# Patient Record
Sex: Female | Born: 1955
Health system: Southern US, Community
[De-identification: ages and names within clinical notes are randomized; demographics above are authoritative.]

## PROBLEM LIST (undated history)

## (undated) DIAGNOSIS — R112 Nausea with vomiting, unspecified: Secondary | ICD-10-CM

## (undated) DIAGNOSIS — F419 Anxiety disorder, unspecified: Secondary | ICD-10-CM

## (undated) DIAGNOSIS — Z98811 Dental restoration status: Secondary | ICD-10-CM

## (undated) DIAGNOSIS — K30 Functional dyspepsia: Secondary | ICD-10-CM

## (undated) DIAGNOSIS — T7840XA Allergy, unspecified, initial encounter: Secondary | ICD-10-CM

## (undated) DIAGNOSIS — Z9889 Other specified postprocedural states: Secondary | ICD-10-CM

## (undated) DIAGNOSIS — D369 Benign neoplasm, unspecified site: Secondary | ICD-10-CM

## (undated) HISTORY — DX: Allergy, unspecified, initial encounter: T78.40XA

## (undated) HISTORY — PX: TONSILLECTOMY: SUR1361

## (undated) HISTORY — PX: CHOLECYSTECTOMY: SHX55

## (undated) HISTORY — PX: TUBAL LIGATION: SHX77

## (undated) HISTORY — PX: WISDOM TOOTH EXTRACTION: SHX21

---

## 1997-12-03 ENCOUNTER — Other Ambulatory Visit: Admission: RE | Admit: 1997-12-03 | Discharge: 1997-12-03 | Payer: Self-pay | Admitting: *Deleted

## 1997-12-14 ENCOUNTER — Ambulatory Visit (HOSPITAL_COMMUNITY): Admission: RE | Admit: 1997-12-14 | Discharge: 1997-12-14 | Payer: Self-pay | Admitting: *Deleted

## 1999-05-22 ENCOUNTER — Other Ambulatory Visit: Admission: RE | Admit: 1999-05-22 | Discharge: 1999-05-22 | Payer: Self-pay | Admitting: *Deleted

## 2000-09-20 ENCOUNTER — Other Ambulatory Visit: Admission: RE | Admit: 2000-09-20 | Discharge: 2000-09-20 | Payer: Self-pay | Admitting: *Deleted

## 2001-10-09 ENCOUNTER — Other Ambulatory Visit: Admission: RE | Admit: 2001-10-09 | Discharge: 2001-10-09 | Payer: Self-pay | Admitting: *Deleted

## 2003-04-14 ENCOUNTER — Other Ambulatory Visit: Admission: RE | Admit: 2003-04-14 | Discharge: 2003-04-14 | Payer: Self-pay | Admitting: *Deleted

## 2003-07-07 ENCOUNTER — Emergency Department (HOSPITAL_COMMUNITY): Admission: AC | Admit: 2003-07-07 | Discharge: 2003-07-07 | Payer: Self-pay

## 2005-04-12 ENCOUNTER — Other Ambulatory Visit: Admission: RE | Admit: 2005-04-12 | Discharge: 2005-04-12 | Payer: Self-pay | Admitting: *Deleted

## 2011-10-22 ENCOUNTER — Ambulatory Visit (INDEPENDENT_AMBULATORY_CARE_PROVIDER_SITE_OTHER): Payer: BC Managed Care – PPO | Admitting: Family Medicine

## 2011-10-22 VITALS — BP 122/68 | HR 87 | Temp 98.0°F | Resp 16 | Ht 65.75 in | Wt 224.0 lb

## 2011-10-22 DIAGNOSIS — M25879 Other specified joint disorders, unspecified ankle and foot: Secondary | ICD-10-CM

## 2011-10-22 NOTE — Progress Notes (Signed)
  Subjective:    Patient ID: Daira Hine, female    DOB: [birth year], 56 y.o.   MRN: [id number]  HPI Neviah Braud is a 56 y.o. female Cyst on back of R ankle - past 8-9 months.   Seen in January by dermatology - benign cyst, told may enlarge, but to follow .   Has enlarged at times - bigger past week.  Irritated at times for up to 30 seconds. Hot compresses help temporarily. Tried multiple otc topical treatments - no relief.  Teacher - Proofreader.   Review of Systems  Constitutional: Negative for fever and chills.  Skin: Negative for rash.       Cyst on ankle.       Objective:   Physical Exam  Constitutional: She appears well-developed.  Pulmonary/Chest: Effort normal.  Musculoskeletal:       Feet:  Skin: Skin is warm, dry and intact. No ecchymosis and no rash noted. No erythema.       Assessment & Plan:  Malori Myers is a 56 y.o. female 1. Cyst of joint of ankle or foot    Refer to ortho - Dr. Victorino Dike for removal due to proximity of achilles. In meantime, avoid direct pressure to area/minimize friction.  rtc precautions.

## 2011-11-15 DIAGNOSIS — D369 Benign neoplasm, unspecified site: Secondary | ICD-10-CM

## 2011-11-15 HISTORY — DX: Benign neoplasm, unspecified site: D36.9

## 2011-12-14 ENCOUNTER — Encounter (HOSPITAL_BASED_OUTPATIENT_CLINIC_OR_DEPARTMENT_OTHER): Payer: Self-pay | Admitting: *Deleted

## 2011-12-20 ENCOUNTER — Encounter (HOSPITAL_BASED_OUTPATIENT_CLINIC_OR_DEPARTMENT_OTHER): Payer: Self-pay | Admitting: Certified Registered Nurse Anesthetist

## 2011-12-20 ENCOUNTER — Ambulatory Visit (HOSPITAL_BASED_OUTPATIENT_CLINIC_OR_DEPARTMENT_OTHER): Payer: BC Managed Care – PPO | Admitting: Certified Registered Nurse Anesthetist

## 2011-12-20 ENCOUNTER — Encounter (HOSPITAL_BASED_OUTPATIENT_CLINIC_OR_DEPARTMENT_OTHER): Payer: Self-pay | Admitting: *Deleted

## 2011-12-20 ENCOUNTER — Ambulatory Visit (HOSPITAL_BASED_OUTPATIENT_CLINIC_OR_DEPARTMENT_OTHER)
Admission: RE | Admit: 2011-12-20 | Discharge: 2011-12-20 | Disposition: A | Discharge status: Home / Self Care | Payer: BC Managed Care – PPO | Source: Ambulatory Visit | Attending: Orthopedic Surgery | Admitting: Orthopedic Surgery

## 2011-12-20 ENCOUNTER — Encounter (HOSPITAL_BASED_OUTPATIENT_CLINIC_OR_DEPARTMENT_OTHER)
Admission: RE | Disposition: A | Discharge status: Home / Self Care | Payer: Self-pay | Source: Ambulatory Visit | Attending: Orthopedic Surgery

## 2011-12-20 DIAGNOSIS — K3189 Other diseases of stomach and duodenum: Secondary | ICD-10-CM | POA: Insufficient documentation

## 2011-12-20 DIAGNOSIS — R1013 Epigastric pain: Secondary | ICD-10-CM | POA: Insufficient documentation

## 2011-12-20 DIAGNOSIS — R229 Localized swelling, mass and lump, unspecified: Secondary | ICD-10-CM

## 2011-12-20 DIAGNOSIS — D2120 Benign neoplasm of connective and other soft tissue of unspecified lower limb, including hip: Secondary | ICD-10-CM | POA: Insufficient documentation

## 2011-12-20 HISTORY — DX: Benign neoplasm, unspecified site: D36.9

## 2011-12-20 HISTORY — DX: Nausea with vomiting, unspecified: Z98.890

## 2011-12-20 HISTORY — DX: Dental restoration status: Z98.811

## 2011-12-20 HISTORY — DX: Nausea with vomiting, unspecified: R11.2

## 2011-12-20 HISTORY — DX: Functional dyspepsia: K30

## 2011-12-20 HISTORY — PX: MASS EXCISION: SHX2000

## 2011-12-20 HISTORY — DX: Anxiety disorder, unspecified: F41.9

## 2011-12-20 LAB — POCT HEMOGLOBIN-HEMACUE: Hemoglobin: 11.3 g/dL — ABNORMAL LOW (ref 12.0–15.0)

## 2011-12-20 SURGERY — EXCISION MASS
Anesthesia: General | Site: Ankle | Laterality: Right | Wound class: Clean

## 2011-12-20 MED ORDER — DEXAMETHASONE SODIUM PHOSPHATE 10 MG/ML IJ SOLN
INTRAMUSCULAR | Status: DC | PRN
  Administered 2011-12-20: 10 mg via INTRAVENOUS

## 2011-12-20 MED ORDER — MIDAZOLAM HCL 5 MG/5ML IJ SOLN
INTRAMUSCULAR | Status: DC | PRN
  Administered 2011-12-20: 1 mg via INTRAVENOUS

## 2011-12-20 MED ORDER — HYDROCODONE-ACETAMINOPHEN 5-325 MG PO TABS: 1.0000 | ORAL_TABLET | Freq: Four times a day (QID) | ORAL | Status: AC | PRN

## 2011-12-20 MED ORDER — ONDANSETRON HCL 4 MG/2ML IJ SOLN
INTRAMUSCULAR | Status: DC | PRN
  Administered 2011-12-20: 4 mg via INTRAVENOUS

## 2011-12-20 MED ORDER — FENTANYL CITRATE 0.05 MG/ML IJ SOLN
INTRAMUSCULAR | Status: DC | PRN
  Administered 2011-12-20: 50 ug via INTRAVENOUS

## 2011-12-20 MED ORDER — OXYCODONE HCL 5 MG/5ML PO SOLN: 5.0000 mg | Freq: Once | ORAL | Status: DC | PRN

## 2011-12-20 MED ORDER — CEFAZOLIN SODIUM-DEXTROSE 2-3 GM-% IV SOLR
2.0000 g | INTRAVENOUS | Status: AC
  Administered 2011-12-20: 2 g via INTRAVENOUS

## 2011-12-20 MED ORDER — SCOPOLAMINE 1 MG/3DAYS TD PT72
1.0000 | MEDICATED_PATCH | Freq: Once | TRANSDERMAL | Status: DC
  Administered 2011-12-20: 1.5 mg via TRANSDERMAL

## 2011-12-20 MED ORDER — BUPIVACAINE HCL (PF) 0.25 % IJ SOLN
INTRAMUSCULAR | Status: DC | PRN
  Administered 2011-12-20: 8 mL

## 2011-12-20 MED ORDER — LACTATED RINGERS IV SOLN
INTRAVENOUS | Status: DC
  Administered 2011-12-20: 20 mL/h via INTRAVENOUS
  Administered 2011-12-20: 12:00:00 via INTRAVENOUS

## 2011-12-20 MED ORDER — CHLORHEXIDINE GLUCONATE 4 % EX LIQD: 60.0000 mL | Freq: Once | CUTANEOUS | Status: DC

## 2011-12-20 MED ORDER — OXYCODONE HCL 5 MG PO TABS: 5.0000 mg | ORAL_TABLET | Freq: Once | ORAL | Status: DC | PRN

## 2011-12-20 MED ORDER — ACETAMINOPHEN 10 MG/ML IV SOLN
1000.0000 mg | Freq: Once | INTRAVENOUS | Status: AC
  Administered 2011-12-20: 1000 mg via INTRAVENOUS

## 2011-12-20 MED ORDER — METOCLOPRAMIDE HCL 5 MG/ML IJ SOLN
INTRAMUSCULAR | Status: DC | PRN
  Administered 2011-12-20: 10 mg via INTRAVENOUS

## 2011-12-20 MED ORDER — PROPOFOL 10 MG/ML IV BOLUS
INTRAVENOUS | Status: DC | PRN
  Administered 2011-12-20: 250 mg via INTRAVENOUS

## 2011-12-20 MED ORDER — HYDROMORPHONE HCL PF 1 MG/ML IJ SOLN: 0.2500 mg | INTRAMUSCULAR | Status: DC | PRN

## 2011-12-20 MED ORDER — METOCLOPRAMIDE HCL 5 MG/ML IJ SOLN
10.0000 mg | Freq: Once | INTRAMUSCULAR | Status: AC | PRN
  Administered 2011-12-20: 10 mg via INTRAVENOUS

## 2011-12-20 MED ORDER — LIDOCAINE HCL (CARDIAC) 20 MG/ML IV SOLN
INTRAVENOUS | Status: DC | PRN
  Administered 2011-12-20: 60 mg via INTRAVENOUS

## 2011-12-20 MED ORDER — SODIUM CHLORIDE 0.9 % IV SOLN: INTRAVENOUS | Status: DC

## 2011-12-20 MED ORDER — BACITRACIN ZINC 500 UNIT/GM EX OINT
TOPICAL_OINTMENT | CUTANEOUS | Status: DC | PRN
  Administered 2011-12-20: 1 via TOPICAL

## 2011-12-20 SURGICAL SUPPLY — 60 items
BANDAGE CONFORM 2  STR LF (GAUZE/BANDAGES/DRESSINGS) IMPLANT
BANDAGE CONFORM 3  STR LF (GAUZE/BANDAGES/DRESSINGS) ×3 IMPLANT
BANDAGE ELASTIC 4 VELCRO ST LF (GAUZE/BANDAGES/DRESSINGS) ×2 IMPLANT
BANDAGE ESMARK 6X9 LF (GAUZE/BANDAGES/DRESSINGS) IMPLANT
BLADE MINI RND TIP GREEN BEAV (BLADE) ×1 IMPLANT
BLADE SURG 15 STRL LF DISP TIS (BLADE) ×4 IMPLANT
BLADE SURG 15 STRL SS (BLADE) ×6
BNDG CMPR 9X4 STRL LF SNTH (GAUZE/BANDAGES/DRESSINGS) ×2
BNDG CMPR 9X6 STRL LF SNTH (GAUZE/BANDAGES/DRESSINGS)
BNDG COHESIVE 4X5 TAN STRL (GAUZE/BANDAGES/DRESSINGS) ×1 IMPLANT
BNDG ESMARK 4X9 LF (GAUZE/BANDAGES/DRESSINGS) ×3 IMPLANT
BNDG ESMARK 6X9 LF (GAUZE/BANDAGES/DRESSINGS)
CHLORAPREP W/TINT 26ML (MISCELLANEOUS) ×3 IMPLANT
CLOTH BEACON ORANGE TIMEOUT ST (SAFETY) ×3 IMPLANT
CORDS BIPOLAR (ELECTRODE) ×3 IMPLANT
COVER TABLE BACK 60X90 (DRAPES) ×3 IMPLANT
CUFF TOURNIQUET SINGLE 18IN (TOURNIQUET CUFF) IMPLANT
DRAPE EXTREMITY T 121X128X90 (DRAPE) ×3 IMPLANT
DRAPE SURG 17X23 STRL (DRAPES) ×3 IMPLANT
DRSG EMULSION OIL 3X3 NADH (GAUZE/BANDAGES/DRESSINGS) ×3 IMPLANT
DRSG PAD ABDOMINAL 8X10 ST (GAUZE/BANDAGES/DRESSINGS) ×1 IMPLANT
DRSG TEGADERM 4X4.75 (GAUZE/BANDAGES/DRESSINGS) ×2 IMPLANT
ELECT REM PT RETURN 9FT ADLT (ELECTROSURGICAL) ×3
ELECTRODE REM PT RTRN 9FT ADLT (ELECTROSURGICAL) ×2 IMPLANT
GLOVE BIO SURGEON STRL SZ8 (GLOVE) ×3 IMPLANT
GLOVE BIOGEL PI IND STRL 8 (GLOVE) ×2 IMPLANT
GLOVE BIOGEL PI INDICATOR 8 (GLOVE) ×1
GOWN PREVENTION PLUS XLARGE (GOWN DISPOSABLE) ×3 IMPLANT
GOWN PREVENTION PLUS XXLARGE (GOWN DISPOSABLE) ×3 IMPLANT
NDL HYPO 25X1 1.5 SAFETY (NEEDLE) IMPLANT
NEEDLE HYPO 25X1 1.5 SAFETY (NEEDLE) IMPLANT
NS IRRIG 1000ML POUR BTL (IV SOLUTION) ×3 IMPLANT
PACK BASIN DAY SURGERY FS (CUSTOM PROCEDURE TRAY) ×3 IMPLANT
PAD CAST 4YDX4 CTTN HI CHSV (CAST SUPPLIES) ×1 IMPLANT
PADDING CAST ABS 4INX4YD NS (CAST SUPPLIES)
PADDING CAST ABS COTTON 4X4 ST (CAST SUPPLIES) ×1 IMPLANT
PADDING CAST COTTON 4X4 STRL (CAST SUPPLIES)
SHEET MEDIUM DRAPE 40X70 STRL (DRAPES) ×3 IMPLANT
SPONGE GAUZE 4X4 12PLY (GAUZE/BANDAGES/DRESSINGS) ×6 IMPLANT
SPONGE LAP 18X18 X RAY DECT (DISPOSABLE) ×3 IMPLANT
STOCKINETTE 4X48 STRL (DRAPES) IMPLANT
STOCKINETTE 6  STRL (DRAPES) ×1
STOCKINETTE 6 STRL (DRAPES) ×1 IMPLANT
STRIP CLOSURE SKIN 1/2X4 (GAUZE/BANDAGES/DRESSINGS) ×3 IMPLANT
SUCTION FRAZIER TIP 10 FR DISP (SUCTIONS) IMPLANT
SUT ETHILON 4 0 PS 2 18 (SUTURE) ×3 IMPLANT
SUT MNCRL AB 4-0 PS2 18 (SUTURE) ×3 IMPLANT
SUT PROLENE 3 0 PS 1 (SUTURE) ×3 IMPLANT
SUT PROLENE 3 0 PS 2 (SUTURE) ×2 IMPLANT
SUT VIC AB 2-0 SH 18 (SUTURE) IMPLANT
SUT VIC AB 3-0 PS1 18 (SUTURE)
SUT VIC AB 3-0 PS1 18XBRD (SUTURE) IMPLANT
SUT VICRYL 4-0 PS2 18IN ABS (SUTURE) IMPLANT
SYR BULB 3OZ (MISCELLANEOUS) ×3 IMPLANT
SYR CONTROL 10ML LL (SYRINGE) ×2 IMPLANT
TOWEL OR 17X24 6PK STRL BLUE (TOWEL DISPOSABLE) ×3 IMPLANT
TUBE CONNECTING 20X1/4 (TUBING) IMPLANT
UNDERPAD 30X30 INCONTINENT (UNDERPADS AND DIAPERS) ×3 IMPLANT
WATER STERILE IRR 1000ML POUR (IV SOLUTION) ×1 IMPLANT
YANKAUER SUCT BULB TIP NO VENT (SUCTIONS) IMPLANT

## 2011-12-20 NOTE — Anesthesia Preprocedure Evaluation (Signed)
Anesthesia Evaluation  Patient identified by MRN, date of birth, ID band Patient awake    Reviewed: Allergy & Precautions, H&P , NPO status , Patient's Chart, lab work & pertinent test results, reviewed documented beta blocker date and time   History of Anesthesia Complications (+) PONV  Airway Mallampati: II TM Distance: >3 FB Neck ROM: full    Dental   Pulmonary neg pulmonary ROS,  breath sounds clear to auscultation        Cardiovascular negative cardio ROS  Rhythm:regular     Neuro/Psych Anxiety negative neurological ROS  negative psych ROS   GI/Hepatic negative GI ROS, Neg liver ROS,   Endo/Other  negative endocrine ROS  Renal/GU negative Renal ROS  negative genitourinary   Musculoskeletal   Abdominal   Peds  Hematology negative hematology ROS (+)   Anesthesia Other Findings See surgeon's H&P   Reproductive/Obstetrics negative OB ROS                           Anesthesia Physical Anesthesia Plan  ASA: II  Anesthesia Plan: General   Post-op Pain Management:    Induction: Intravenous  Airway Management Planned: LMA  Additional Equipment:   Intra-op Plan:   Post-operative Plan: Extubation in OR  Informed Consent: I have reviewed the patients History and Physical, chart, labs and discussed the procedure including the risks, benefits and alternatives for the proposed anesthesia with the patient or authorized representative who has indicated his/her understanding and acceptance.   Dental Advisory Given  Plan Discussed with: CRNA and Surgeon  Anesthesia Plan Comments:         Anesthesia Quick Evaluation

## 2011-12-20 NOTE — H&P (Signed)
Caroline Bullock is an 56 y.o. female.   Chief Complaint: mass right ankle HPI: 56 y/o female with painful mass at posterior right ankle.  She presents now for excision.  Past Medical History  Diagnosis Date  . PONV (postoperative nausea and vomiting)   . Acid indigestion     rare - TUMS as needed  . Anxiety     no current meds.  . Dental crowns present     also a lower dental implant  . Benign tumor 11/2011    right ankle    Past Surgical History  Procedure Date  . Tonsillectomy   . Cesarean section     x 3  . Cholecystectomy   . Wisdom tooth extraction     History reviewed. No pertinent family history. Social History:  reports that she has never smoked. She has never used smokeless tobacco. She reports that she drinks alcohol. She reports that she does not use illicit drugs.  Allergies:  Allergies  Allergen Reactions  . Benadryl (Diphenhydramine Hcl) Hives  . Fish-Derived Products Nausea And Vomiting  . Penicillins Hives  . Shellfish Allergy Nausea And Vomiting    No prescriptions prior to admission    No results found for this or any previous visit (from the past 48 hour(s)). No results found.  ROS  No recent f/c/n/v/ wt loss  Height 5\' 6"  (1.676 m), weight 97.523 kg (215 lb). Physical Exam  wn wd woman in nad.  A and O X 4.  Mood and affect normal.  EOMI.  Respirations unlabored.  R distal leg with small subcutaneous mass adjacent to achilles tendon.  TTP.  Skin heatlhy.  No lymphadenopathy.  Sens to LT intact in sural n dist.  5/5 strength in PF and DF of ankle.  Assessment/Plan Right leg mass - to OR for excision.  The risks and benefits of the alternative treatment options have been discussed in detail.  The patient wishes to proceed with surgery and specifically understands risks of bleeding, infection, nerve damage, blood clots, need for additional surgery, amputation and death.   Toni Arthurs 01-09-12, 7:24 AM

## 2011-12-20 NOTE — Brief Op Note (Signed)
12/20/2011  12:42 PM  PATIENT:  Caroline Bullock  56 y.o. female  PRE-OPERATIVE DIAGNOSIS:  Right ankle subcutaneous mass  POST-OPERATIVE DIAGNOSIS:  Same  Procedure(s): Excision of right ankle subcutaneous mass  SURGEON:  Toni Arthurs, MD  ASSISTANT: n/a  ANESTHESIA:   General  EBL:  minimal   TOURNIQUET:  3 min at 225 mm Hg  COMPLICATIONS:  None apparent  DISPOSITION:  Extubated, awake and stable to recovery.  DICTATION ID:  [id number]

## 2011-12-20 NOTE — Transfer of Care (Signed)
Immediate Anesthesia Transfer of Care Note  Patient: Caroline Bullock  Procedure(s) Performed: Procedure(s) (LRB) with comments: EXCISION LIPOMA (Right)  Patient Location: PACU  Anesthesia Type: General  Level of Consciousness: awake, alert , oriented and patient cooperative  Airway & Oxygen Therapy: Patient Spontanous Breathing and Patient connected to face mask oxygen  Post-op Assessment: Report given to PACU RN and Post -op Vital signs reviewed and stable  Post vital signs: Reviewed and stable  Complications: No apparent anesthesia complications

## 2011-12-20 NOTE — Anesthesia Postprocedure Evaluation (Signed)
Anesthesia Post Note  Patient: Caroline Bullock  Procedure(s) Performed: Procedure(s) (LRB): EXCISION MASS (Right)  Anesthesia type: General  Patient location: PACU  Post pain: Pain level controlled  Post assessment: Patient's Cardiovascular Status Stable  Last Vitals:  Filed Vitals:   12/20/11 1400  BP: 107/54  Pulse: 86  Temp:   Resp: 16    Post vital signs: Reviewed and stable  Level of consciousness: alert  Complications: No apparent anesthesia complications

## 2011-12-21 ENCOUNTER — Encounter (HOSPITAL_BASED_OUTPATIENT_CLINIC_OR_DEPARTMENT_OTHER): Payer: Self-pay | Admitting: Orthopedic Surgery

## 2011-12-21 NOTE — Op Note (Signed)
NAME:  Caroline Bullock, Caroline Bullock                   ACCOUNT NO.:  [id number]  MEDICAL RECORD NO.:  [id number]  LOCATION:                                 FACILITY:  PHYSICIAN:  Toni Arthurs, MD        DATE OF BIRTH:  1955/05/07  DATE OF PROCEDURE:  12/20/2011 DATE OF DISCHARGE:                              OPERATIVE REPORT   PREOPERATIVE DIAGNOSIS:  Right ankle subcutaneous mass.  POSTOPERATIVE DIAGNOSIS:  Right ankle subcutaneous mass.  PROCEDURE:  Excision of right ankle subcutaneous mass.  SURGEON:  Toni Arthurs, MD  ANESTHESIA:  General.  ESTIMATED BLOOD LOSS:  Minimal.  TOURNIQUET TIME:  3 minutes at 225 mmHg.  COMPLICATIONS:  None apparent.  DISPOSITION:  Extubated, awake and stable to recovery.  INDICATIONS FOR PROCEDURE:  The patient is a 56 year old female who has developed a painful mass at the posterior aspect of her right ankle over the last several months.  This is quite tender to palpation and is bothersome with shoe wear.  She presents now for operative excision of this mass.  She understands the risks, benefits, and the alternative treatment options and elects surgical treatment.  She specifically understands the risks of bleeding, infection, nerve damage, blood clots, need for additional surgery, recurrence of the mass, amputation, and death.  PROCEDURE IN DETAIL:  After preoperative consent was obtained and the correct operative site was identified, the patient was brought to the operating room and placed supine on the operating table.  General anesthesia was induced.  Preoperative antibiotics were administered. Surgical time-out was taken.  The patient was then turned into the lateral decubitus position with the right side up.  The right lower extremity was prepped and draped in standard sterile fashion with tourniquet around the thigh.  Extremity was exsanguinated and the tourniquet was inflated to 225 mmHg.  The patient's mass was identified just on the lateral  aspect of the Achilles tendon at the level of the ankle.  A longitudinal incision was made over the mass.  Sharp dissection was carried down through the skin.  The mass was noted to be approximately 1 cm x 5 mm and roughly capsule shaped.  It was well circumscribed.  Blunt dissection was carried around the mass circumferentially and it was excised in its entirety.  It was sent off the field as a specimen to Pathology.  The wound was irrigated copiously.  There was no evidence of other adjacent masses.  The wound was irrigated.  The tourniquet was released.  Hemostasis was achieved. Inverted simple sutures of 3-0 Monocryl were used to close the subcutaneous tissue and horizontal mattress sutures of 3-0 Prolene were used to close the skin incision.  Sterile dressings were applied after 0.25% Marcaine was infiltrated into the subcutaneous tissue around the incision site.  A compression wrap was then applied.  The patient was then awakened from anesthesia and transported to the recovery room in stable condition.  FOLLOWUP PLAN:  The patient will be weightbearing as tolerated on the right lower extremity.  She will follow up with me in 2 weeks for suture removal.     Toni Arthurs, MD  JH/MEDQ  D:  12/20/2011  T:  12/21/2011  Job:  [id number]

## 2012-03-25 ENCOUNTER — Ambulatory Visit: Payer: BC Managed Care – PPO

## 2012-03-25 ENCOUNTER — Ambulatory Visit (INDEPENDENT_AMBULATORY_CARE_PROVIDER_SITE_OTHER): Payer: BC Managed Care – PPO | Admitting: Family Medicine

## 2012-03-25 VITALS — BP 128/76 | HR 105 | Temp 98.3°F | Resp 17 | Ht 66.0 in | Wt 218.0 lb

## 2012-03-25 DIAGNOSIS — R059 Cough, unspecified: Secondary | ICD-10-CM

## 2012-03-25 DIAGNOSIS — R062 Wheezing: Secondary | ICD-10-CM

## 2012-03-25 DIAGNOSIS — R05 Cough: Secondary | ICD-10-CM

## 2012-03-25 DIAGNOSIS — J40 Bronchitis, not specified as acute or chronic: Secondary | ICD-10-CM

## 2012-03-25 DIAGNOSIS — J019 Acute sinusitis, unspecified: Secondary | ICD-10-CM

## 2012-03-25 LAB — POCT CBC
Granulocyte percent: 54.4 %G (ref 37–80)
HCT, POC: 47.4 % (ref 37.7–47.9)
Hemoglobin: 14.4 g/dL (ref 12.2–16.2)
Lymph, poc: 1.5 (ref 0.6–3.4)
MCH, POC: 28.7 pg (ref 27–31.2)
MCHC: 30.4 g/dL — AB (ref 31.8–35.4)
MCV: 94.5 fL (ref 80–97)
MID (cbc): 0.6 (ref 0–0.9)
MPV: 10 fL (ref 0–99.8)
POC Granulocyte: 2.5 (ref 2–6.9)
POC LYMPH PERCENT: 31.9 %L (ref 10–50)
POC MID %: 13.7 % — AB (ref 0–12)
Platelet Count, POC: 242 10*3/uL (ref 142–424)
RBC: 5.02 M/uL (ref 4.04–5.48)
RDW, POC: 14.8 %
WBC: 4.6 10*3/uL (ref 4.6–10.2)

## 2012-03-25 MED ORDER — DOXYCYCLINE HYCLATE 100 MG PO TABS: 100.0000 mg | ORAL_TABLET | Freq: Two times a day (BID) | ORAL | Status: DC

## 2012-03-25 MED ORDER — ALBUTEROL SULFATE HFA 108 (90 BASE) MCG/ACT IN AERS: 2.0000 | INHALATION_SPRAY | Freq: Four times a day (QID) | RESPIRATORY_TRACT | Status: DC | PRN

## 2012-03-25 NOTE — Progress Notes (Signed)
[facility]:  Office Visit  Chief Complaint:  Chief Complaint  Patient presents with  . Cough    wheezing   . Nasal Congestion    HPI: Caroline Bullock is a 56 y.o. female who complains of  Cough and wheezing and chest congestiona nd nasal congestion x 7 days, rested but does not feel better. She is a Runner, broadcasting/film/video. Initially started as tickle in her throat. Taken some advil for HA. + fevers, chills.   Past Medical History  Diagnosis Date  . PONV (postoperative nausea and vomiting)   . Acid indigestion     rare - TUMS as needed  . Anxiety     no current meds.  . Dental crowns present     also a lower dental implant  . Benign tumor 11/2011    right ankle   Past Surgical History  Procedure Date  . Tonsillectomy   . Cesarean section     x 3  . Cholecystectomy   . Wisdom tooth extraction   . Mass excision 12/20/2011    Procedure: EXCISION MASS;  Surgeon: Toni Arthurs, MD;  Location: [facility];  Service: Orthopedics;  Laterality: Right;   History   Social History  . Marital Status: Married    Spouse Name: N/A    Number of Children: N/A  . Years of Education: N/A   Social History Main Topics  . Smoking status: Never Smoker   . Smokeless tobacco: Never Used  . Alcohol Use: 1.8 oz/week    3 Glasses of wine per week     Comment: glass wine 2 x/week  . Drug Use: No  . Sexually Active: Yes    Birth Control/ Protection: None   Other Topics Concern  . None   Social History Narrative  . None   History reviewed. No pertinent family history. Allergies  Allergen Reactions  . Benadryl (Diphenhydramine Hcl) Hives  . Fish-Derived Products Nausea And Vomiting  . Penicillins Hives  . Shellfish Allergy Nausea And Vomiting   Prior to Admission medications   Not on File     ROS: The patient denies  night sweats, unintentional weight loss, chest pain, palpitations, wheezing, dyspnea on exertion, nausea, vomiting, abdominal pain, dysuria, hematuria,  melena, numbness, weakness, or tingling.   All other systems have been reviewed and were otherwise negative with the exception of those mentioned in the HPI and as above.    PHYSICAL EXAM: Filed Vitals:   03/25/12 1841  BP: 128/76  Pulse: 105  Temp: 98.3 F (36.8 C)  Resp: 17   Filed Vitals:   03/25/12 1841  Height: 5\' 6"  (1.676 m)  Weight: 218 lb (98.884 kg)   Body mass index is 35.19 kg/(m^2).  General: Alert, no acute distress HEENT:  Normocephalic, atraumatic, oropharynx patent. + sinus tenderness, TM nl, no exudates Cardiovascular:  Regular rate and rhythm, no rubs murmurs or gallops.  No Carotid bruits, radial pulse intact. No pedal edema.  Respiratory: Clear to auscultation bilaterally.  No wheezes, rales, + rhonchi diffuse.  No cyanosis, no use of accessory musculature GI: No organomegaly, abdomen is soft and non-tender, positive bowel sounds.  No masses. Skin: No rashes. Neurologic: Facial musculature symmetric. Psychiatric: Patient is appropriate throughout our interaction. Lymphatic: No cervical lymphadenopathy Musculoskeletal: Gait intact.   LABS: Results for orders placed in visit on 03/25/12  POCT CBC      Component Value Range   WBC 4.6  4.6 - 10.2 K/uL   Lymph,  poc 1.5  0.6 - 3.4   POC LYMPH PERCENT 31.9  10 - 50 %L   MID (cbc) 0.6  0 - 0.9   POC MID % 13.7 (*) 0 - 12 %M   POC Granulocyte 2.5  2 - 6.9   Granulocyte percent 54.4  37 - 80 %G   RBC 5.02  4.04 - 5.48 M/uL   Hemoglobin 14.4  12.2 - 16.2 g/dL   HCT, POC 16.1  09.6 - 47.9 %   MCV 94.5  80 - 97 fL   MCH, POC 28.7  27 - 31.2 pg   MCHC 30.4 (*) 31.8 - 35.4 g/dL   RDW, POC 04.5     Platelet Count, POC 242  142 - 424 K/uL   MPV 10.0  0 - 99.8 fL     EKG/XRAY:   Primary read interpreted by [facility] Conley Rolls [facility]. Increased vascular markings diffusely No obvious infiltrates, pneomothorax   ASSESSMENT/PLAN: Encounter Diagnoses  Name Primary?  . Wheeze Yes  . Cough   . Acute sinusitis   .  Bronchitis    1. Rx Doxycycline due to PCN allergy 2. Rx Albuterol INH prn  F/u prn for worsening sxs OTC meds for sxs Note for work 3 days off  Patient was called with official radiology report dx Right upper lobe PNA, f/u in 3-4 weeks after completion of abx for repeat xray   Trenyce Loera PHUONG, DO 03/25/2012 7:38 PM

## 2012-03-26 ENCOUNTER — Telehealth: Payer: Self-pay

## 2012-03-26 NOTE — Telephone Encounter (Signed)
PT WAS SEEN LAST NIGHT AND PRESCRIBED DOXYCYLINE.  THE FIRST DOSE SHE WAS FINE, BUT THE SECOND DOSE MADE HER VERY NAUSEATED.  SHOULD SHE CONTINUE OR TRY TO TAKE IT WITH FOOD?  SAYS THE BOTTLE SAYS TO TAKE WITHOUT FOOD.  CALL [phone]

## 2012-03-27 NOTE — Telephone Encounter (Signed)
Advised patient to take with food, or a little something like toast or cracker, not dairy. She said that she tried with food last night and was fine as well as this morning.

## 2012-04-15 ENCOUNTER — Ambulatory Visit (INDEPENDENT_AMBULATORY_CARE_PROVIDER_SITE_OTHER): Payer: BC Managed Care – PPO | Admitting: Family Medicine

## 2012-04-15 ENCOUNTER — Ambulatory Visit: Payer: BC Managed Care – PPO

## 2012-04-15 VITALS — BP 121/65 | HR 77 | Temp 98.2°F | Resp 16 | Ht 65.0 in | Wt 216.0 lb

## 2012-04-15 DIAGNOSIS — J189 Pneumonia, unspecified organism: Secondary | ICD-10-CM

## 2012-04-15 NOTE — Progress Notes (Signed)
  Subjective:    Patient ID: Caroline Bullock, female    DOB: [birth year], 56 y.o.   MRN: [id number] Chief Complaint  Patient presents with  . Follow-up    wheezing    HPI  Feeling better, haven't needed inhaler in over 2 wks.    Review of Systems    BP 121/65  Pulse 77  Temp(Src) 98.2 F (36.8 C) (Oral)  Resp 16  Ht 5\' 5"  (1.651 m)  Wt 216 lb (97.977 kg)  BMI 35.94 kg/m2  SpO2 97% Objective:   Physical Exam         [facility] reading (PRIMARY) by  Dr. Clelia Croft.  Normal, RUL infiltrate seems to have resolved.  Assessment & Plan:  Pneumonia - Plan: DG Chest 2 View  No orders of the defined types were placed in this encounter.

## 2012-11-11 ENCOUNTER — Encounter: Payer: Self-pay | Admitting: Emergency Medicine

## 2012-12-24 ENCOUNTER — Encounter: Payer: Self-pay | Admitting: Family Medicine

## 2013-01-22 ENCOUNTER — Encounter: Payer: Self-pay | Admitting: Family Medicine

## 2013-11-06 ENCOUNTER — Ambulatory Visit (INDEPENDENT_AMBULATORY_CARE_PROVIDER_SITE_OTHER): Payer: BC Managed Care – PPO | Admitting: Family Medicine

## 2013-11-06 ENCOUNTER — Encounter: Payer: Self-pay | Admitting: Family Medicine

## 2013-11-06 VITALS — BP 110/72 | HR 77 | Temp 98.2°F | Resp 16 | Ht 66.0 in | Wt 223.2 lb

## 2013-11-06 DIAGNOSIS — Z1231 Encounter for screening mammogram for malignant neoplasm of breast: Secondary | ICD-10-CM

## 2013-11-06 DIAGNOSIS — Z0000 Encounter for general adult medical examination without abnormal findings: Secondary | ICD-10-CM

## 2013-11-06 DIAGNOSIS — Z124 Encounter for screening for malignant neoplasm of cervix: Secondary | ICD-10-CM

## 2013-11-06 DIAGNOSIS — E559 Vitamin D deficiency, unspecified: Secondary | ICD-10-CM

## 2013-11-06 LAB — COMPLETE METABOLIC PANEL WITH GFR
ALT: 24 U/L (ref 0–35)
Albumin: 4.5 g/dL (ref 3.5–5.2)
CO2: 25 mEq/L (ref 19–32)
Calcium: 9.4 mg/dL (ref 8.4–10.5)
GFR, Est African American: >89 mL/min
Potassium: 4 mEq/L (ref 3.5–5.3)
Total Protein: 7 g/dL (ref 6.0–8.3)

## 2013-11-06 LAB — POCT URINALYSIS DIPSTICK
Bilirubin, UA: NEGATIVE
Glucose, UA: NEGATIVE
Ketones, UA: NEGATIVE
Leukocytes, UA: NEGATIVE
Nitrite, UA: NEGATIVE
Protein, UA: NEGATIVE
Spec Grav, UA: 1.025
Urobilinogen, UA: 0.2
pH, UA: 5.5

## 2013-11-06 LAB — CBC
HCT: 42.3 % (ref 36.0–46.0)
Hemoglobin: 14.5 g/dL (ref 12.0–15.0)
MCH: 29.7 pg (ref 26.0–34.0)
MCHC: 34.3 g/dL (ref 30.0–36.0)
MCV: 86.7 fL (ref 78.0–100.0)
Platelets: 282 10*3/uL (ref 150–400)
RBC: 4.88 MIL/uL (ref 3.87–5.11)
RDW: 14.7 % (ref 11.5–15.5)
WBC: 7.1 10*3/uL (ref 4.0–10.5)

## 2013-11-06 LAB — POCT UA - MICROSCOPIC ONLY
Casts, Ur, LPF, POC: NEGATIVE
Crystals, Ur, HPF, POC: NEGATIVE
Mucus, UA: POSITIVE
Yeast, UA: NEGATIVE

## 2013-11-06 LAB — COMPLETE METABOLIC PANEL WITHOUT GFR
AST: 19 U/L (ref 0–37)
Alkaline Phosphatase: 67 U/L (ref 39–117)
BUN: 15 mg/dL (ref 6–23)
Chloride: 104 meq/L (ref 96–112)
Creat: 0.75 mg/dL (ref 0.50–1.10)
GFR, Est Non African American: 87 mL/min
Glucose, Bld: 91 mg/dL (ref 70–99)
Sodium: 140 meq/L (ref 135–145)
Total Bilirubin: 0.5 mg/dL (ref 0.2–1.2)

## 2013-11-06 LAB — LIPID PANEL
Cholesterol: 223 mg/dL — ABNORMAL HIGH (ref 0–200)
HDL: 63 mg/dL (ref >39)
LDL Cholesterol: 146 mg/dL — ABNORMAL HIGH (ref 0–99)
Total CHOL/HDL Ratio: 3.5 ratio
Triglycerides: 69 mg/dL (ref <150)
VLDL: 14 mg/dL (ref 0–40)

## 2013-11-06 LAB — TSH: TSH: 1.341 u[IU]/mL (ref 0.350–4.500)

## 2013-11-07 LAB — VITAMIN D 25 HYDROXY (VIT D DEFICIENCY, FRACTURES): Vit D, 25-Hydroxy: 33 ng/mL (ref 30–89)

## 2013-11-08 ENCOUNTER — Encounter: Payer: Self-pay | Admitting: Family Medicine

## 2013-11-08 NOTE — Progress Notes (Signed)
Chief Complaint:  Chief Complaint  Patient presents with  . Annual Exam    w/pap    HPI: Caroline Bullock is a 58 y.o. female who is here for  Annual visit with pap She has no complaints  She is a Pharmacist, hospital She has had a normal mammogram 2014 Does not remember when she last had a pap, no prior abnormal pap She saw a Dr in [place] for colonscopy in 12/03/2012-it was normal with polypectomy and was told to return in 5 years She does not get flu vaccines She got her Td in 2013 She is G3P3L3   Past Medical History  Diagnosis Date  . PONV (postoperative nausea and vomiting)   . Acid indigestion     rare - TUMS as needed  . Anxiety     no current meds.  . Dental crowns present     also a lower dental implant  . Benign tumor 11/2011    right ankle   Past Surgical History  Procedure Laterality Date  . Tonsillectomy    . Cesarean section      x 3  . Cholecystectomy    . Wisdom tooth extraction    . Mass excision  12/20/2011    Procedure: EXCISION MASS;  Surgeon: Wylene Simmer, MD;  Location: [facility];  Service: Orthopedics;  Laterality: Right;  . Tubal ligation     History   Social History  . Marital Status: Married    Spouse Name: N/A    Number of Children: N/A  . Years of Education: N/A   Social History Main Topics  . Smoking status: Never Smoker   . Smokeless tobacco: Never Used  . Alcohol Use: 1.8 - 3.0 oz/week    3-5 Glasses of wine per week     Comment: glass wine 2 x/week  . Drug Use: No  . Sexual Activity: Yes    Birth Control/ Protection: None   Other Topics Concern  . None   Social History Narrative   Married. Education: [employer]. Exercise: Yes   Family History  Problem Relation Age of Onset  . Cancer Mother     spot on lung  . Diabetes Mother   . Heart disease Father     vascular  . Diabetes Sister    Allergies  Allergen Reactions  . Benadryl [Diphenhydramine Hcl] Hives  . Fish-Derived Products Nausea And Vomiting  .  Penicillins Hives  . Shellfish Allergy Nausea And Vomiting   Prior to Admission medications   Medication Sig Start Date End Date Taking? Authorizing Provider  albuterol (PROVENTIL HFA;VENTOLIN HFA) 108 (90 BASE) MCG/ACT inhaler Inhale 2 puffs into the lungs every 6 (six) hours as needed for wheezing. 03/25/12   Ily Denno P Tore Carreker, DO  doxycycline (VIBRA-TABS) 100 MG tablet Take 1 tablet (100 mg total) by mouth 2 (two) times daily. 03/25/12   Ilian Wessell P Coltrane Tugwell, DO     ROS: The patient denies fevers, chills, night sweats, unintentional weight loss, chest pain, palpitations, wheezing, dyspnea on exertion, nausea, vomiting, abdominal pain, dysuria, hematuria, melena, numbness, weakness, or tingling.   All other systems have been reviewed and were otherwise negative with the exception of those mentioned in the HPI and as above.    PHYSICAL EXAM: Filed Vitals:   11/06/13 0929  BP: 110/72  Pulse: 77  Temp: 98.2 F (36.8 C)  Resp: 16   Filed Vitals:   11/06/13 0929  Height: 5\' 6"  (1.676 m)  Weight:  223 lb 3.2 oz (101.243 kg)   Body mass index is 36.04 kg/(m^2).  General: Alert, no acute distress HEENT:  Normocephalic, atraumatic, oropharynx patent. EOMI, PERRLA, fundo exam normal Cardiovascular:  Regular rate and rhythm, no rubs murmurs or gallops.  No Carotid bruits, radial pulse intact. No pedal edema.  Respiratory: Clear to auscultation bilaterally.  No wheezes, rales, or rhonchi.  No cyanosis, no use of accessory musculature GI: No organomegaly, abdomen is soft and non-tender, positive bowel sounds.  No masses. Skin: No rashes. Neurologic: Facial musculature symmetric. Psychiatric: Patient is appropriate throughout our interaction. Lymphatic: No cervical lymphadenopathy Musculoskeletal: Gait intact. 5/5 strength 2/2 DTRs GU -normal cervix, no masses or lesions Breast exam normal  LABS: Results for orders placed in visit on 11/06/13  COMPLETE METABOLIC PANEL WITH GFR      Result Value Ref  Range   Sodium 140  135 - 145 mEq/L   Potassium 4.0  3.5 - 5.3 mEq/L   Chloride 104  96 - 112 mEq/L   CO2 25  19 - 32 mEq/L   Glucose, Bld 91  70 - 99 mg/dL   BUN 15  6 - 23 mg/dL   Creat 0.75  0.50 - 1.10 mg/dL   Total Bilirubin 0.5  0.2 - 1.2 mg/dL   Alkaline Phosphatase 67  39 - 117 U/L   AST 19  0 - 37 U/L   ALT 24  0 - 35 U/L   Total Protein 7.0  6.0 - 8.3 g/dL   Albumin 4.5  3.5 - 5.2 g/dL   Calcium 9.4  8.4 - 10.5 mg/dL   GFR, Est African American >89     GFR, Est Non African American 87    CBC      Result Value Ref Range   WBC 7.1  4.0 - 10.5 K/uL   RBC 4.88  3.87 - 5.11 MIL/uL   Hemoglobin 14.5  12.0 - 15.0 g/dL   HCT 42.3  36.0 - 46.0 %   MCV 86.7  78.0 - 100.0 fL   MCH 29.7  26.0 - 34.0 pg   MCHC 34.3  30.0 - 36.0 g/dL   RDW 14.7  11.5 - 15.5 %   Platelets 282  150 - 400 K/uL  LIPID PANEL      Result Value Ref Range   Cholesterol 223 (*) 0 - 200 mg/dL   Triglycerides 69  <150 mg/dL   HDL 63  >39 mg/dL   Total CHOL/HDL Ratio 3.5     VLDL 14  0 - 40 mg/dL   LDL Cholesterol 146 (*) 0 - 99 mg/dL  TSH      Result Value Ref Range   TSH 1.341  0.350 - 4.500 uIU/mL  VITAMIN D 25 HYDROXY      Result Value Ref Range   Vit D, 25-Hydroxy 33  30 - 89 ng/mL  POCT URINALYSIS DIPSTICK      Result Value Ref Range   Color, UA yellow     Clarity, UA clear     Glucose, UA neg     Bilirubin, UA neg     Ketones, UA neg     Spec Grav, UA 1.025     Blood, UA trace     pH, UA 5.5     Protein, UA neg     Urobilinogen, UA 0.2     Nitrite, UA neg     Leukocytes, UA Negative    POCT UA - MICROSCOPIC ONLY  Result Value Ref Range   WBC, Ur, HPF, POC 6-10     RBC, urine, microscopic 0-1     Bacteria, U Microscopic 1+     Mucus, UA pos     Epithelial cells, urine per micros 0-1     Crystals, Ur, HPF, POC neg     Casts, Ur, LPF, POC neg     Yeast, UA neg       EKG/XRAY:   Primary read interpreted by Dr. Marin Comment at [facility].   ASSESSMENT/PLAN: Encounter Diagnoses  Name  Primary?  . Annual physical exam Yes  . Screening for cervical cancer   . Unspecified vitamin D deficiency   . 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  Annual labs pending: CBC, CMP, lipids, TSH, Vit D  UTD on mammogram for 2014, will get one for 2015 Colonoscopy < 5 years , no hemosure needed since normal colonscopy with benign polyps, return to GI for colonscopy in 2019  Pap pending Hematuria, needs followup for this, repeat urine dip with micro , will call her about it when all labs come in F/u prn otherwise in 1 year  Gross sideeffects, risk and benefits, and alternatives of medications d/w patient. Patient is aware that all medications have potential sideeffects and we are unable to predict every sideeffect or drug-drug interaction that may occur.  Rupal Childress, [place], DO 11/08/2013 9:35 AM

## 2013-11-10 ENCOUNTER — Telehealth: Payer: Self-pay | Admitting: Family Medicine

## 2013-11-10 DIAGNOSIS — R319 Hematuria, unspecified: Secondary | ICD-10-CM

## 2013-11-10 LAB — PAP IG W/ RFLX HPV ASCU

## 2013-11-10 NOTE — Telephone Encounter (Signed)
Spoke to patient about labs, She will change and modify diet/exercise for hyperlipidemia. She will come in and give urine specimen for hematuria. Has a  Hx UTIs in past with hematuria but not hematuis by itself. Nonsmoker.

## 2013-12-07 ENCOUNTER — Encounter: Payer: Self-pay | Admitting: Family Medicine

## 2014-11-09 IMAGING — CR DG CHEST 2V
2 series · 2 of 2 positions shown · non-contrast
Comparison: 03/25/2012

CLINICAL DATA: Follow-up pneumonia.

CHEST - 2 VIEW

[PA]
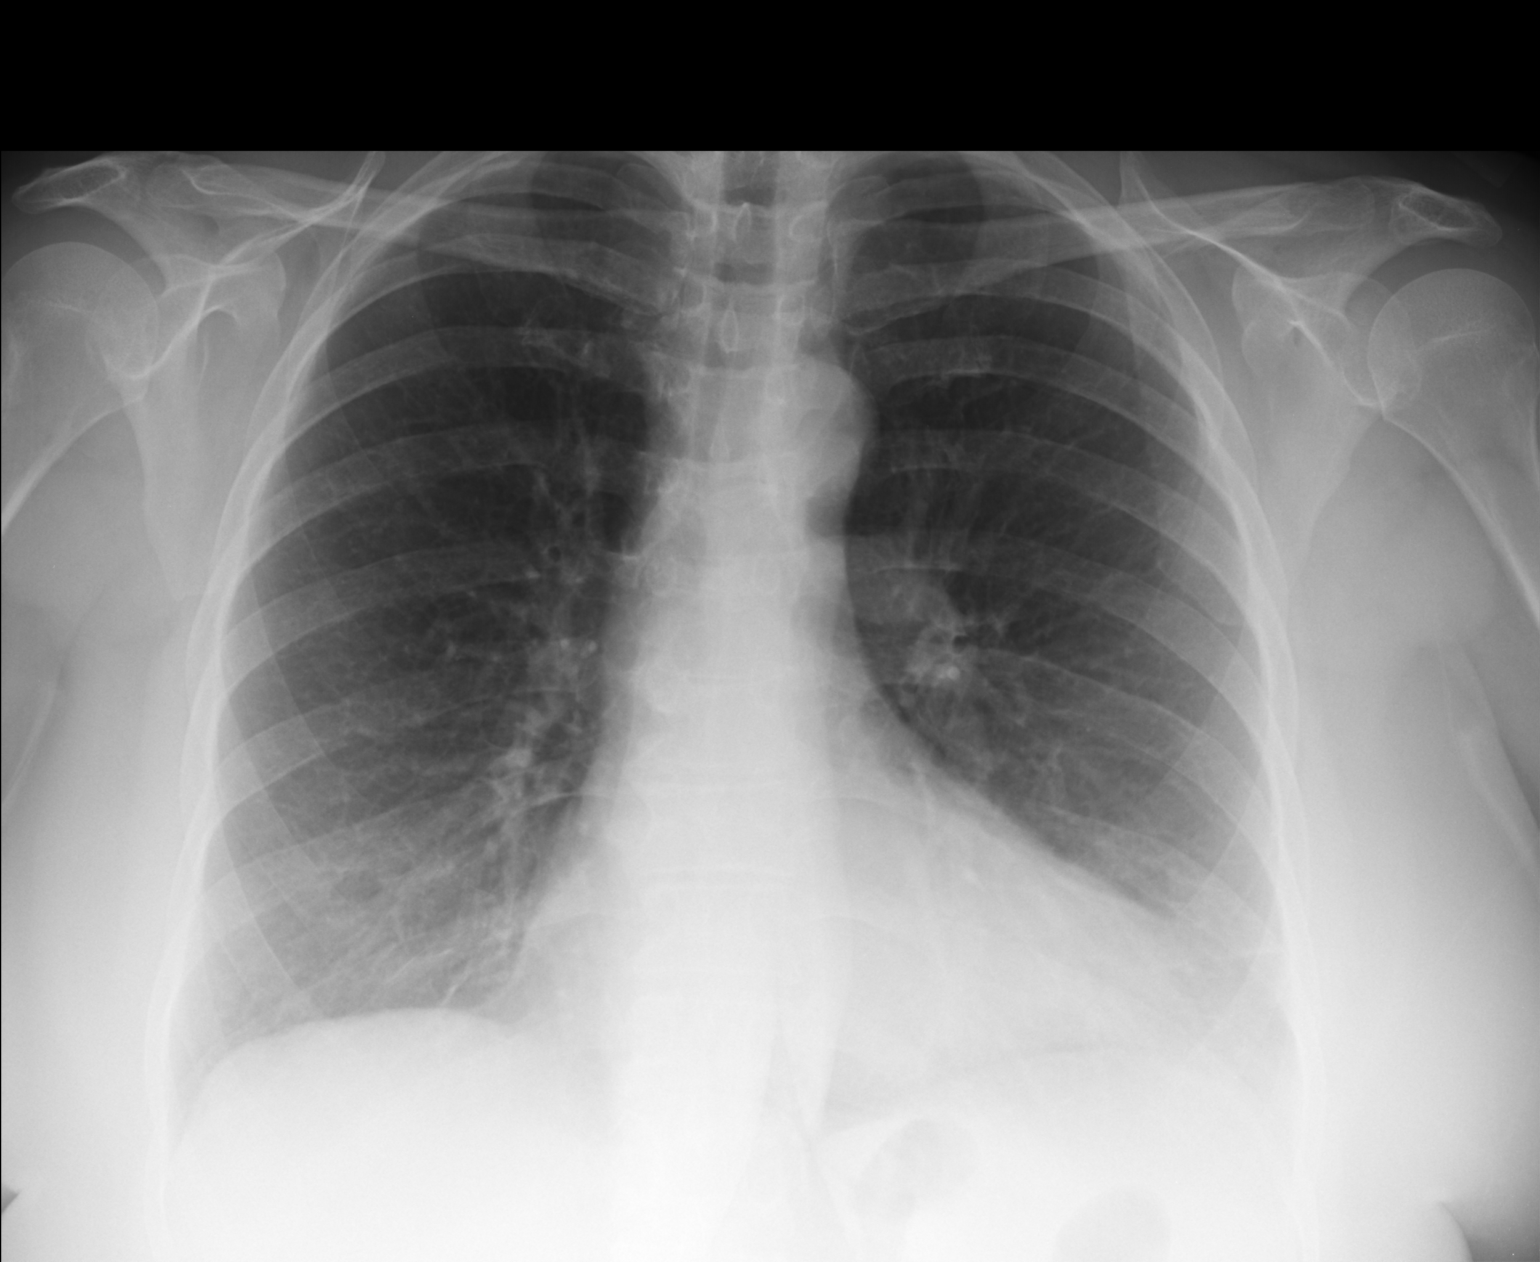

[lateral]
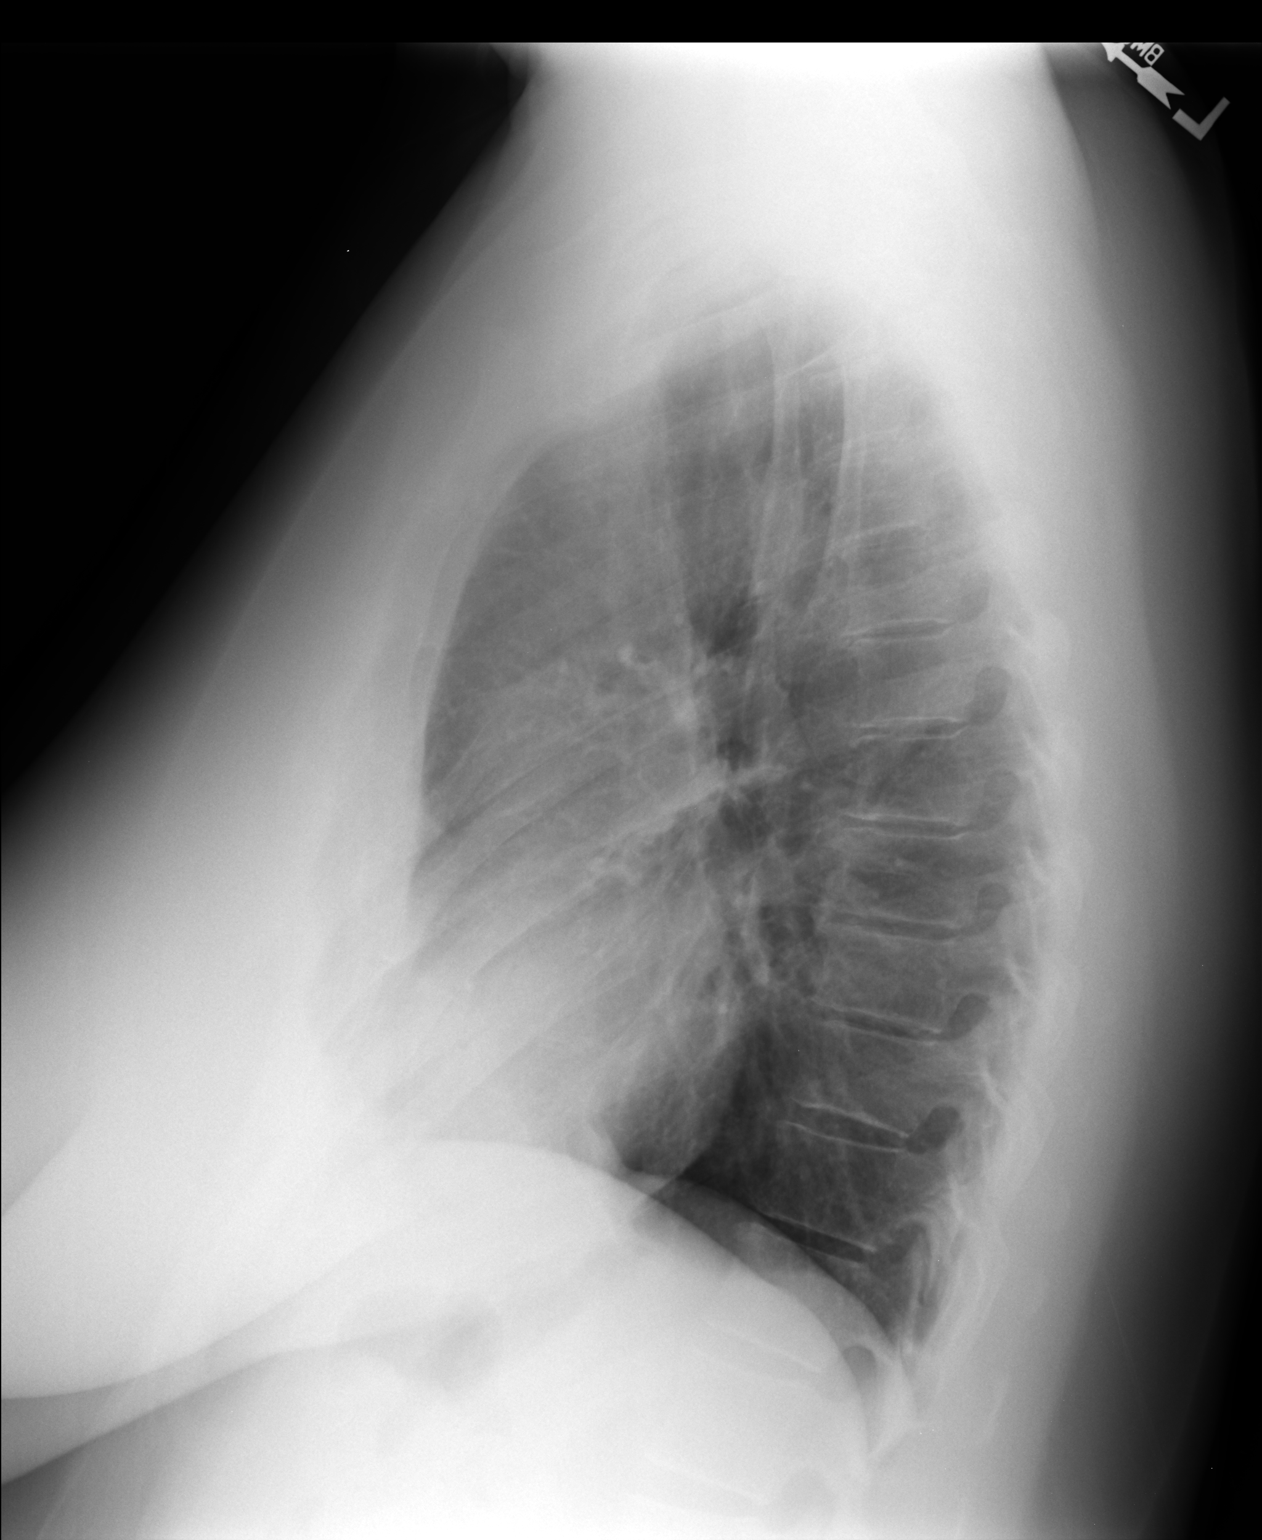

[2 of 2 positions shown; findings below may reference images not displayed]

FINDINGS: Previously seen ill-defined opacity in the right upper
lobe is no longer visualized.  Both lungs are clear.  No evidence
of pleural effusion.  Heart size is within normal limits and is
stable.  No mass or lymphadenopathy identified.  Mild pectus
excavatum again noted.
IMPRESSION: Resolution of right upper lobe infiltrate.  No active disease.

## 2014-12-06 ENCOUNTER — Telehealth: Payer: Self-pay

## 2014-12-06 DIAGNOSIS — R928 Other abnormal and inconclusive findings on diagnostic imaging of breast: Secondary | ICD-10-CM

## 2014-12-06 LAB — HM MAMMOGRAPHY

## 2014-12-06 NOTE — Telephone Encounter (Signed)
[facility] called to get an order for Dx MM to further eval an abnormal finding on R breast screening MM. Spoke to Donnel[place] to get auth since pt has not been in for over a year. Junie Bame order and I also put order in for [place] in case it is needed after doing the Dx MM.

## 2015-01-07 ENCOUNTER — Encounter: Payer: Self-pay | Admitting: Family Medicine

## 2015-12-06 ENCOUNTER — Ambulatory Visit (INDEPENDENT_AMBULATORY_CARE_PROVIDER_SITE_OTHER): Payer: BC Managed Care – PPO | Admitting: Family Medicine

## 2015-12-06 ENCOUNTER — Encounter: Payer: Self-pay | Admitting: Family Medicine

## 2015-12-06 VITALS — BP 142/70 | HR 88 | Temp 98.1°F | Resp 16 | Ht 66.75 in | Wt 210.6 lb

## 2015-12-06 DIAGNOSIS — M545 Low back pain, unspecified: Secondary | ICD-10-CM

## 2015-12-06 DIAGNOSIS — M62830 Muscle spasm of back: Secondary | ICD-10-CM

## 2015-12-06 MED ORDER — METHOCARBAMOL 500 MG PO TABS: ORAL_TABLET | ORAL | 0 refills | Status: DC

## 2015-12-06 MED ORDER — DICLOFENAC SODIUM 75 MG PO TBEC: 75.0000 mg | DELAYED_RELEASE_TABLET | Freq: Two times a day (BID) | ORAL | 0 refills | Status: DC

## 2015-12-06 MED ORDER — TRAMADOL HCL 50 MG PO TABS: ORAL_TABLET | ORAL | 0 refills | Status: DC

## 2015-12-06 NOTE — Patient Instructions (Addendum)
Continue to try to avoid strenuous activity.  Do gentle stretches  Take the methocarbamol muscle relaxant 500 mg 1 in the morning, 1 in the afternoon, and 2 at bedtime  Take the diclofenac anti-inflammatory pain reliever 75 mg one twice daily. Do not take ibuprofen or Aleve while you're using this.  Additionally can take acetaminophen (Tylenol) maximum of 1000 mg 3 times daily (2500 mg)  For worse pain try tramadol 50 mg 1 every 6 hours as needed  Heat or ice or alternating heat and ice may give good relief  Use the massage therapy as desired  Return if not improving   Back Exercises The following exercises strengthen the muscles that help to support the back. They also help to keep the lower back flexible. Doing these exercises can help to prevent back pain or lessen existing pain. If you have back pain or discomfort, try doing these exercises 2-3 times each day or as told by your health care provider. When the pain goes away, do them once each day, but increase the number of times that you repeat the steps for each exercise (do more repetitions). If you do not have back pain or discomfort, do these exercises once each day or as told by your health care provider. EXERCISES Single Knee to Chest Repeat these steps 3-5 times for each leg: 1. Lie on your back on a firm bed or the floor with your legs extended. 2. Bring one knee to your chest. Your other leg should stay extended and in contact with the floor. 3. Hold your knee in place by grabbing your knee or thigh. 4. Pull on your knee until you feel a gentle stretch in your lower back. 5. Hold the stretch for 10-30 seconds. 6. Slowly release and straighten your leg. Pelvic Tilt Repeat these steps 5-10 times: 1. Lie on your back on a firm bed or the floor with your legs extended. 2. Bend your knees so they are pointing toward the ceiling and your feet are flat on the floor. 3. Tighten your lower abdominal muscles to press your lower  back against the floor. This motion will tilt your pelvis so your tailbone points up toward the ceiling instead of pointing to your feet or the floor. 4. With gentle tension and even breathing, hold this position for 5-10 seconds. Cat-Cow Repeat these steps until your lower back becomes more flexible: 1. Get into a hands-and-knees position on a firm surface. Keep your hands under your shoulders, and keep your knees under your hips. You may place padding under your knees for comfort. 2. Let your head hang down, and point your tailbone toward the floor so your lower back becomes rounded like the back of a cat. 3. Hold this position for 5 seconds. 4. Slowly lift your head and point your tailbone up toward the ceiling so your back forms a sagging arch like the back of a cow. 5. Hold this position for 5 seconds. Press-Ups Repeat these steps 5-10 times: 1. Lie on your abdomen (face-down) on the floor. 2. Place your palms near your head, about shoulder-width apart. 3. While you keep your back as relaxed as possible and keep your hips on the floor, slowly straighten your arms to raise the top half of your body and lift your shoulders. Do not use your back muscles to raise your upper torso. You may adjust the placement of your hands to make yourself more comfortable. 4. Hold this position for 5 seconds while you keep  your back relaxed. 5. Slowly return to lying flat on the floor. Bridges Repeat these steps 10 times: 1. Lie on your back on a firm surface. 2. Bend your knees so they are pointing toward the ceiling and your feet are flat on the floor. 3. Tighten your buttocks muscles and lift your buttocks off of the floor until your waist is at almost the same height as your knees. You should feel the muscles working in your buttocks and the back of your thighs. If you do not feel these muscles, slide your feet 1-2 inches farther away from your buttocks. 4. Hold this position for 3-5 seconds. 5. Slowly  lower your hips to the starting position, and allow your buttocks muscles to relax completely. If this exercise is too easy, try doing it with your arms crossed over your chest. Abdominal Crunches Repeat these steps 5-10 times: 1. Lie on your back on a firm bed or the floor with your legs extended. 2. Bend your knees so they are pointing toward the ceiling and your feet are flat on the floor. 3. Cross your arms over your chest. 4. Tip your chin slightly toward your chest without bending your neck. 5. Tighten your abdominal muscles and slowly raise your trunk (torso) high enough to lift your shoulder blades a tiny bit off of the floor. Avoid raising your torso higher than that, because it can put too much stress on your low back and it does not help to strengthen your abdominal muscles. 6. Slowly return to your starting position. Back Lifts Repeat these steps 5-10 times: 1. Lie on your abdomen (face-down) with your arms at your sides, and rest your forehead on the floor. 2. Tighten the muscles in your legs and your buttocks. 3. Slowly lift your chest off of the floor while you keep your hips pressed to the floor. Keep the back of your head in line with the curve in your back. Your eyes should be looking at the floor. 4. Hold this position for 3-5 seconds. 5. Slowly return to your starting position. SEEK MEDICAL CARE IF:  Your back pain or discomfort gets much worse when you do an exercise.  Your back pain or discomfort does not lessen within 2 hours after you exercise. If you have any of these problems, stop doing these exercises right away. Do not do them again unless your health care provider says that you can. SEEK IMMEDIATE MEDICAL CARE IF:  You develop sudden, severe back pain. If this happens, stop doing the exercises right away. Do not do them again unless your health care provider says that you can.   This information is not intended to replace advice given to you by your health  care provider. Make sure you discuss any questions you have with your health care provider.   Document Released: 05/10/2004 Document Revised: 12/22/2014 Document Reviewed: 05/27/2014 Elsevier Interactive Patient Education 2016 [employer].   IF you received an x-ray today, you will receive an invoice from [facility]. Please contact [facility] at [phone] with questions or concerns regarding your invoice.   IF you received labwork today, you will receive an invoice from [employer]. Please contact [facility] at [phone] with questions or concerns regarding your invoice.   Our billing staff will not be able to assist you with questions regarding bills from these companies.  You will be contacted with the lab results as soon as they are available. The fastest way to get your results is  to activate your My Chart account. Instructions are located on the last page of this paperwork. If you have not heard from us regarding the results in 2 weeks, please contact this office.      

## 2015-12-06 NOTE — Progress Notes (Signed)
Patient ID: Caroline Bullock, female    DOB: 1956/03/05  Age: 60 y.o. MRN: [id number]  Chief Complaint  Patient presents with  . Back Pain    Subjective:   60 year old lady who was at the beach on vacation, sitting in low beach chairs, walking barefoot. She started feeling tight in her back. She had back strain and pain problems 25 years ago but is done well over the years. She came home and did some other work around the house. 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 Her back is been getting to bother her more and more. She cannot get comfortable in bed at night and has a great deal of difficulty getting up or down.  Current allergies, medications, problem list, past/family and social histories reviewed.  Objective:  BP (!) 142/70 (BP Location: Right Arm, Patient Position: Sitting, Cuff Size: Normal)   Pulse 88   Temp 98.1 F (36.7 C) (Oral)   Resp 16   Ht 5' 6.75" (1.695 m)   Wt 210 lb 9.6 oz (95.5 kg)   SpO2 96%   BMI 33.23 kg/m   Obvious distress. Overweight lady. She discussed with me the fact that she has thought about getting a reduction mammoplasty as she has big breaths and she knows that aggravates her back. Her back is very tender in the paraspinous muscles in the low back. Very decreased motion of all directions. I cannot get her to lay down to do straight leg raising. He was too uncomfortable. Seated straight leg raising was negative. Deep tendon reflexes 1+ and symmetrical. Sensory grossly normal in the lower extremities.  Assessment & Plan:   Assessment: 1. Bilateral low back pain without sciatica   2. Back muscle spasm       Plan: Treat symptomatically. Return as needed.      Patient Instructions   Continue to try to avoid strenuous activity.  Do gentle stretches  Take the methocarbamol muscle relaxant 500 mg 1 in the morning, 1 in the afternoon, and 2 at bedtime  Take the diclofenac anti-inflammatory pain reliever 75 mg one  twice daily. Do not take ibuprofen or Aleve while you're using this.  Additionally can take acetaminophen (Tylenol) maximum of 1000 mg 3 times daily (2500 mg)  For worse pain try tramadol 50 mg 1 every 6 hours as needed  Heat or ice or alternating heat and ice may give good relief  Use the massage therapy as desired  Return if not improving   Back Exercises The following exercises strengthen the muscles that help to support the back. They also help to keep the lower back flexible. Doing these exercises can help to prevent back pain or lessen existing pain. If you have back pain or discomfort, try doing these exercises 2-3 times each day or as told by your health care provider. When the pain goes away, do them once each day, but increase the number of times that you repeat the steps for each exercise (do more repetitions). If you do not have back pain or discomfort, do these exercises once each day or as told by your health care provider. EXERCISES Single Knee to Chest Repeat these steps 3-5 times for each leg: 1. Lie on your back on a firm bed or the floor with your legs extended. 2. Bring one knee to your chest. Your other leg should stay extended and in contact with the floor. 3. Hold your knee in place by grabbing your  knee or thigh. 4. Pull on your knee until you feel a gentle stretch in your lower back. 5. Hold the stretch for 10-30 seconds. 6. Slowly release and straighten your leg. Pelvic Tilt Repeat these steps 5-10 times: 1. Lie on your back on a firm bed or the floor with your legs extended. 2. Bend your knees so they are pointing toward the ceiling and your feet are flat on the floor. 3. Tighten your lower abdominal muscles to press your lower back against the floor. This motion will tilt your pelvis so your tailbone points up toward the ceiling instead of pointing to your feet or the floor. 4. With gentle tension and even breathing, hold this position for 5-10  seconds. Cat-Cow Repeat these steps until your lower back becomes more flexible: 1. Get into a hands-and-knees position on a firm surface. Keep your hands under your shoulders, and keep your knees under your hips. You may place padding under your knees for comfort. 2. Let your head hang down, and point your tailbone toward the floor so your lower back becomes rounded like the back of a cat. 3. Hold this position for 5 seconds. 4. Slowly lift your head and point your tailbone up toward the ceiling so your back forms a sagging arch like the back of a cow. 5. Hold this position for 5 seconds. Press-Ups Repeat these steps 5-10 times: 1. Lie on your abdomen (face-down) on the floor. 2. Place your palms near your head, about shoulder-width apart. 3. While you keep your back as relaxed as possible and keep your hips on the floor, slowly straighten your arms to raise the top half of your body and lift your shoulders. Do not use your back muscles to raise your upper torso. You may adjust the placement of your hands to make yourself more comfortable. 4. Hold this position for 5 seconds while you keep your back relaxed. 5. Slowly return to lying flat on the floor. Bridges Repeat these steps 10 times: 1. Lie on your back on a firm surface. 2. Bend your knees so they are pointing toward the ceiling and your feet are flat on the floor. 3. Tighten your buttocks muscles and lift your buttocks off of the floor until your waist is at almost the same height as your knees. You should feel the muscles working in your buttocks and the back of your thighs. If you do not feel these muscles, slide your feet 1-2 inches farther away from your buttocks. 4. Hold this position for 3-5 seconds. 5. Slowly lower your hips to the starting position, and allow your buttocks muscles to relax completely. If this exercise is too easy, try doing it with your arms crossed over your chest. Abdominal Crunches Repeat these steps 5-10  times: 1. Lie on your back on a firm bed or the floor with your legs extended. 2. Bend your knees so they are pointing toward the ceiling and your feet are flat on the floor. 3. Cross your arms over your chest. 4. Tip your chin slightly toward your chest without bending your neck. 5. Tighten your abdominal muscles and slowly raise your trunk (torso) high enough to lift your shoulder blades a tiny bit off of the floor. Avoid raising your torso higher than that, because it can put too much stress on your low back and it does not help to strengthen your abdominal muscles. 6. Slowly return to your starting position. Back Lifts Repeat these steps 5-10 times: 1. Lie on  your abdomen (face-down) with your arms at your sides, and rest your forehead on the floor. 2. Tighten the muscles in your legs and your buttocks. 3. Slowly lift your chest off of the floor while you keep your hips pressed to the floor. Keep the back of your head in line with the curve in your back. Your eyes should be looking at the floor. 4. Hold this position for 3-5 seconds. 5. Slowly return to your starting position. SEEK MEDICAL CARE IF:  Your back pain or discomfort gets much worse when you do an exercise.  Your back pain or discomfort does not lessen within 2 hours after you exercise. If you have any of these problems, stop doing these exercises right away. Do not do them again unless your health care provider says that you can. SEEK IMMEDIATE MEDICAL CARE IF:  You develop sudden, severe back pain. If this happens, stop doing the exercises right away. Do not do them again unless your health care provider says that you can.   This information is not intended to replace advice given to you by your health care provider. Make sure you discuss any questions you have with your health care provider.   Document Released: 05/10/2004 Document Revised: 12/22/2014 Document Reviewed: 05/27/2014 Elsevier Interactive Patient Education  2016 [employer].   IF you received an x-ray today, you will receive an invoice from [facility]. Please contact [facility] at [phone] with questions or concerns regarding your invoice.   IF you received labwork today, you will receive an invoice from [employer]. Please contact [facility] at [phone] with questions or concerns regarding your invoice.   Our billing staff will not be able to assist you with questions regarding bills from these companies.  You will be contacted with the lab results as soon as they are available. The fastest way to get your results is to activate your My Chart account. Instructions are located on the last page of this paperwork. If you have not heard from [place] regarding the results in 2 weeks, please contact this office.         No Follow-up on file.   HOPPER,DAVID, MD 12/06/2015

## 2015-12-07 ENCOUNTER — Telehealth: Payer: Self-pay

## 2015-12-07 ENCOUNTER — Telehealth: Payer: Self-pay | Admitting: *Deleted

## 2015-12-07 NOTE — Telephone Encounter (Signed)
Spoke with Philis Fendt advised patient to switch tylenol 1000 mg and Motrin 400-600 mg every 4 hours.  Patient understood.  Patient did not go to ED she slept for 4 hours and felt great when she woke up.

## 2015-12-07 NOTE — Telephone Encounter (Signed)
Pt was seen by Dr. Linna Darner for Bilateral low back pain without sciatica. She was prescribed several meds. Today she is dizzy and throwing up. Her husband would like to know what he should do. Please advise at [phone]

## 2015-12-07 NOTE — Telephone Encounter (Signed)
LM advising husband to bring pt in or the ED.

## 2015-12-08 ENCOUNTER — Encounter: Payer: BC Managed Care – PPO | Admitting: Family Medicine

## 2015-12-29 ENCOUNTER — Ambulatory Visit (INDEPENDENT_AMBULATORY_CARE_PROVIDER_SITE_OTHER): Payer: BC Managed Care – PPO | Admitting: Family Medicine

## 2015-12-29 ENCOUNTER — Encounter: Payer: Self-pay | Admitting: Family Medicine

## 2015-12-29 VITALS — BP 104/66 | HR 84 | Temp 98.2°F | Resp 18 | Ht 66.75 in | Wt 209.0 lb

## 2015-12-29 DIAGNOSIS — Z1212 Encounter for screening for malignant neoplasm of rectum: Secondary | ICD-10-CM

## 2015-12-29 DIAGNOSIS — G8929 Other chronic pain: Secondary | ICD-10-CM

## 2015-12-29 DIAGNOSIS — Z136 Encounter for screening for cardiovascular disorders: Secondary | ICD-10-CM

## 2015-12-29 DIAGNOSIS — Z1383 Encounter for screening for respiratory disorder NEC: Secondary | ICD-10-CM

## 2015-12-29 DIAGNOSIS — Z1211 Encounter for screening for malignant neoplasm of colon: Secondary | ICD-10-CM | POA: Diagnosis not present

## 2015-12-29 DIAGNOSIS — Z1231 Encounter for screening mammogram for malignant neoplasm of breast: Secondary | ICD-10-CM

## 2015-12-29 DIAGNOSIS — K30 Functional dyspepsia: Secondary | ICD-10-CM

## 2015-12-29 DIAGNOSIS — Z1329 Encounter for screening for other suspected endocrine disorder: Secondary | ICD-10-CM | POA: Diagnosis not present

## 2015-12-29 DIAGNOSIS — M546 Pain in thoracic spine: Secondary | ICD-10-CM

## 2015-12-29 DIAGNOSIS — Z6832 Body mass index (BMI) 32.0-32.9, adult: Secondary | ICD-10-CM

## 2015-12-29 DIAGNOSIS — Z1389 Encounter for screening for other disorder: Secondary | ICD-10-CM

## 2015-12-29 DIAGNOSIS — Z124 Encounter for screening for malignant neoplasm of cervix: Secondary | ICD-10-CM | POA: Diagnosis not present

## 2015-12-29 DIAGNOSIS — Z113 Encounter for screening for infections with a predominantly sexual mode of transmission: Secondary | ICD-10-CM | POA: Diagnosis not present

## 2015-12-29 DIAGNOSIS — N62 Hypertrophy of breast: Secondary | ICD-10-CM

## 2015-12-29 DIAGNOSIS — E669 Obesity, unspecified: Secondary | ICD-10-CM

## 2015-12-29 DIAGNOSIS — E559 Vitamin D deficiency, unspecified: Secondary | ICD-10-CM | POA: Diagnosis not present

## 2015-12-29 DIAGNOSIS — Z130 Encounter for screening for diseases of the blood and blood-forming organs and certain disorders involving the immune mechanism: Secondary | ICD-10-CM

## 2015-12-29 DIAGNOSIS — Z0000 Encounter for general adult medical examination without abnormal findings: Secondary | ICD-10-CM | POA: Diagnosis not present

## 2015-12-29 LAB — CBC
HCT: 42.2 % (ref 35.0–45.0)
Hemoglobin: 14 g/dL (ref 11.7–15.5)
MCH: 29.9 pg (ref 27.0–33.0)
MCHC: 33.2 g/dL (ref 32.0–36.0)
MCV: 90.2 fL (ref 80.0–100.0)
MPV: 10.5 fL (ref 7.5–12.5)
PLATELETS: 277 10*3/uL (ref 140–400)
RBC: 4.68 MIL/uL (ref 3.80–5.10)
RDW: 14.5 % (ref 11.0–15.0)
WBC: 6.4 10*3/uL (ref 3.8–10.8)

## 2015-12-29 LAB — POC MICROSCOPIC URINALYSIS (UMFC): MUCUS RE: ABSENT

## 2015-12-29 LAB — TSH: TSH: 1.49 mIU/L

## 2015-12-29 LAB — POCT URINALYSIS DIP (MANUAL ENTRY)
Bilirubin, UA: NEGATIVE
Glucose, UA: NEGATIVE
Ketones, POC UA: NEGATIVE
Leukocytes, UA: NEGATIVE
NITRITE UA: NEGATIVE
PH UA: 6
Protein Ur, POC: NEGATIVE
RBC UA: NEGATIVE
Spec Grav, UA: 1.02
UROBILINOGEN UA: 0.2

## 2015-12-29 MED ORDER — ZOSTER VACCINE LIVE 19400 UNT/0.65ML ~~LOC~~ SUSR: 0.6500 mL | Freq: Once | SUBCUTANEOUS | 0 refills | Status: AC

## 2015-12-29 NOTE — Patient Instructions (Addendum)
   IF you received an x-ray today, you will receive an invoice from [facility]. Please contact [facility] at [phone] with questions or concerns regarding your invoice.   IF you received labwork today, you will receive an invoice from [facility]. Please contact [facility] at [phone] with questions or concerns regarding your invoice.   Our billing staff will not be able to assist you with questions regarding bills from these companies.  You will be contacted with the lab results as soon as they are available. The fastest way to get your results is to activate your My Chart account. Instructions are located on the last page of this paperwork. If you have not heard from us regarding the results in 2 weeks, please contact this office.       Why follow it? Research shows. . Those who follow the Mediterranean diet have a reduced risk of heart disease  . The diet is associated with a reduced incidence of Parkinson's and Alzheimer's diseases . People following the diet may have longer life expectancies and lower rates of chronic diseases  . The Dietary Guidelines for Americans recommends the Mediterranean diet as an eating plan to promote health and prevent disease  What Is the Mediterranean Diet?  . Healthy eating plan based on typical foods and recipes of Mediterranean-style cooking . The diet is primarily a plant based diet; these foods should make up a majority of meals   Starches - Plant based foods should make up a majority of meals - They are an important sources of vitamins, minerals, energy, antioxidants, and fiber - Choose whole grains, foods high in fiber and minimally processed items  - Typical grain sources include wheat, oats, barley, corn, brown rice, bulgar, farro, millet, polenta, couscous  - Various types of beans include chickpeas, lentils, fava beans, black beans, white beans   Fruits  Veggies - Large quantities of  antioxidant rich fruits & veggies; 6 or more servings  - Vegetables can be eaten raw or lightly drizzled with oil and cooked  - Vegetables common to the traditional Mediterranean Diet include: artichokes, arugula, beets, broccoli, brussel sprouts, cabbage, carrots, celery, collard greens, cucumbers, eggplant, kale, leeks, lemons, lettuce, mushrooms, okra, onions, peas, peppers, potatoes, pumpkin, radishes, rutabaga, shallots, spinach, sweet potatoes, turnips, zucchini - Fruits common to the Mediterranean Diet include: apples, apricots, avocados, cherries, clementines, dates, figs, grapefruits, grapes, melons, nectarines, oranges, peaches, pears, pomegranates, strawberries, tangerines  Fats - Replace butter and margarine with healthy oils, such as olive oil, canola oil, and tahini  - Limit nuts to no more than a handful a day  - Nuts include walnuts, almonds, pecans, pistachios, pine nuts  - Limit or avoid candied, honey roasted or heavily salted nuts - Olives are central to the Mediterranean diet - can be eaten whole or used in a variety of dishes   Meats Protein - Limiting red meat: no more than a few times a month - When eating red meat: choose lean cuts and keep the portion to the size of deck of cards - Eggs: approx. 0 to 4 times a week  - Fish and lean poultry: at least 2 a week  - Healthy protein sources include, chicken, turkey, lean beef, lamb - Increase intake of seafood such as tuna, salmon, trout, mackerel, shrimp, scallops - Avoid or limit high fat processed meats such as sausage and bacon  Dairy - Include moderate amounts of low fat dairy products  - Focus on healthy   dairy such as fat free yogurt, skim milk, low or reduced fat cheese - Limit dairy products higher in fat such as whole or 2% milk, cheese, ice cream  Alcohol - Moderate amounts of red wine is ok  - No more than 5 oz daily for women (all ages) and men older than age 65  - No more than 10 oz of wine daily for men younger  than 65  Other - Limit sweets and other desserts  - Use herbs and spices instead of salt to flavor foods  - Herbs and spices common to the traditional Mediterranean Diet include: basil, bay leaves, chives, cloves, cumin, fennel, garlic, lavender, marjoram, mint, oregano, parsley, pepper, rosemary, sage, savory, sumac, tarragon, thyme   It's not just a diet, it's a lifestyle:  . The Mediterranean diet includes lifestyle factors typical of those in the region  . Foods, drinks and meals are best eaten with others and savored . Daily physical activity is important for overall good health . This could be strenuous exercise like running and aerobics . This could also be more leisurely activities such as walking, housework, yard-work, or taking the stairs . Moderation is the key; a balanced and healthy diet accommodates most foods and drinks . Consider portion sizes and frequency of consumption of certain foods   Meal Ideas & Options:  . Breakfast:  o Whole wheat toast or whole wheat English muffins with peanut butter & hard boiled egg o Steel cut oats topped with apples & cinnamon and skim milk  o Fresh fruit: banana, strawberries, melon, berries, peaches  o Smoothies: strawberries, bananas, greek yogurt, peanut butter o Low fat greek yogurt with blueberries and granola  o Egg white omelet with spinach and mushrooms o Breakfast couscous: whole wheat couscous, apricots, skim milk, cranberries  . Sandwiches:  o Hummus and grilled vegetables (peppers, zucchini, squash) on whole wheat bread   o Grilled chicken on whole wheat pita with lettuce, tomatoes, cucumbers or tzatziki  o Tuna salad on whole wheat bread: tuna salad made with greek yogurt, olives, red peppers, capers, green onions o Garlic rosemary lamb pita: lamb sauted with garlic, rosemary, salt & pepper; add lettuce, cucumber, greek yogurt to pita - flavor with lemon juice and black pepper  . Seafood:  o Mediterranean grilled salmon,  seasoned with garlic, basil, parsley, lemon juice and black pepper o Shrimp, lemon, and spinach whole-grain pasta salad made with low fat greek yogurt  o Seared scallops with lemon orzo  o Seared tuna steaks seasoned salt, pepper, coriander topped with tomato mixture of olives, tomatoes, olive oil, minced garlic, parsley, green onions and cappers  . Meats:  o Herbed greek chicken salad with kalamata olives, cucumber, feta  o Red bell peppers stuffed with spinach, bulgur, lean ground beef (or lentils) & topped with feta   o Kebabs: skewers of chicken, tomatoes, onions, zucchini, squash  o Turkey burgers: made with red onions, mint, dill, lemon juice, feta cheese topped with roasted red peppers . Vegetarian o Cucumber salad: cucumbers, artichoke hearts, celery, red onion, feta cheese, tossed in olive oil & lemon juice  o Hummus and whole grain pita points with a greek salad (lettuce, tomato, feta, olives, cucumbers, red onion) o Lentil soup with celery, carrots made with vegetable broth, garlic, salt and pepper  o Tabouli salad: parsley, bulgur, mint, scallions, cucumbers, tomato, radishes, lemon juice, olive oil, salt and pepper.      

## 2015-12-29 NOTE — Progress Notes (Signed)
Subjective:    Patient ID: Caroline Bullock, female    DOB: Dec 07, 1955, 60 y.o.   MRN: [id number] Chief Complaint  Patient presents with  . Annual Exam    HPI   Last CPE 2 year piror  She is a Pharmacist, hospital She has had a normal mammogram 2014 Does not remember when she last had a pap, no prior abnormal pap She saw a Dr in [place] for colonscopy in 12/03/2012-it was normal with polypectomy and was told to return in 5 years She does not get flu vaccines She got her Td in 2013 She is G3P3L3 Pap normla 2 yrs ago - repeat in 3 3 wks piror with back pain diclofenac and methocarbamol with tylneol with trmadol for break thorugh  Saw accupuncture [facility] Gwynneth Munson - book online $20-$40, $10 initial session- [facility] - RN- 3 sessions over a week and she got dramatic improvement - in middle of left buttock.  Tends to vomit with meds. So tries not to take otc.  occ takes elderberry extract when feeling ill or zinc. 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  Plays violin/viola - [employer]  Past Medical History:  Diagnosis Date  . Acid indigestion    rare - TUMS as needed  . Allergy   . Anxiety    no current meds.  . 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 Dental crowns present    also a lower dental implant  . PONV (postoperative nausea and vomiting)    Past Surgical History:  Procedure Laterality Date  . CESAREAN SECTION     x 3  . CHOLECYSTECTOMY    . MASS EXCISION  12/20/2011   Procedure: EXCISION MASS;  Surgeon: Wylene Simmer, MD;  Location: [facility];  Service: Orthopedics;  Laterality: Right;  . TONSILLECTOMY    . TUBAL LIGATION    . WISDOM TOOTH EXTRACTION     No current outpatient prescriptions on  file prior to visit.   No current facility-administered medications on file prior to visit.    Allergies  Allergen Reactions  . Benadryl [Diphenhydramine Hcl] Hives  . Fish-Derived Products Nausea And Vomiting  . Penicillins Hives  . Shellfish Allergy Nausea And Vomiting   Family History  Problem Relation Age of Onset  . Cancer Mother     spot on lung  . Diabetes Mother   . Heart disease Father     vascular  . Diabetes Sister   . Diabetes Maternal Grandmother   . Heart disease Maternal Grandmother    Social History   Social History  . Marital status: Married    Spouse name: N/A  . Number of children: N/A  . Years of education: N/A   Social History Main Topics  . Smoking status: Never Smoker  . Smokeless tobacco: Never Used  . Alcohol use 1.8 - 3.0 oz/week    3 - 5 Glasses of wine per week     Comment: glass wine 2 x/week  . Drug use: No  . Sexual activity: Yes    Birth control/ protection: None   Other Topics Concern  . None   Social History Narrative   Married. Education:  College. Exercise: Yes     Review of Systems As noted above in HPI    Objective:   Physical Exam  Constitutional: She is oriented to person, place, and time. She appears well-developed and well-nourished. No distress.  HENT:  Head: Normocephalic and atraumatic.  Right Ear: Tympanic membrane, external ear and ear canal normal.  Left Ear: Tympanic membrane, external ear and ear canal normal.  Nose: Nose normal. No mucosal edema or rhinorrhea.  Mouth/Throat: Uvula is midline, oropharynx is clear and moist and mucous membranes are normal. No posterior oropharyngeal erythema.  Eyes: Conjunctivae and EOM are normal. Pupils are equal, round, and reactive to light. Right eye exhibits no discharge. Left eye exhibits no discharge. No scleral icterus.  Neck: Normal range of motion. Neck supple. No thyromegaly present.  Cardiovascular: Normal rate, regular rhythm, normal heart sounds and intact  distal pulses.   Pulmonary/Chest: Effort normal and breath sounds normal. No respiratory distress.  Abdominal: Soft. Bowel sounds are normal. There is no tenderness.  Genitourinary: No breast swelling, tenderness, discharge or bleeding.  Genitourinary Comments: Large pendulous breasts  Musculoskeletal: She exhibits no edema.  Lymphadenopathy:    She has no cervical adenopathy.  Neurological: She is alert and oriented to person, place, and time. She has normal reflexes.  Skin: Skin is warm and dry. She is not diaphoretic. No erythema.  Psychiatric: She has a normal mood and affect. Her behavior is normal.      BP 104/66 (BP Location: Right Arm, Patient Position: Sitting, Cuff Size: Small)   Pulse 84   Temp 98.2 F (36.8 C) (Oral)   Resp 18   Ht 5' 6.75" (1.695 m)   Wt 209 lb (94.8 kg)   SpO2 97%   BMI 32.98 kg/m      Assessment & Plan:   1. Annual physical exam   2. Encounter for screening mammogram for breast cancer   3. Screening for cardiovascular, respiratory, and genitourinary diseases   4. Screening for colorectal cancer   5. Screening for deficiency anemia   6. Screening for thyroid disorder   7. Acid indigestion   8. Screening for cervical cancer   9. Screening for STD (sexually transmitted disease)   10. Vitamin D deficiency   11. Macromastia   12. Chronic bilateral thoracic back pain   13. Class 1 obesity without serious comorbidity with body mass index (BMI) of 32.0 to 32.9 in adult, unspecified obesity type   Recommend pt pursue plastic surgery eval to see if she is a candidate for a breast reduction. She has a very healthy active lifestyle and her large breasts cause her to have pain with exercise and prevents weight loss.   Orders Placed This Encounter  Procedures  . CBC  . Comprehensive metabolic panel    Order Specific Question:   Has the patient fasted?    Answer:   Yes  . TSH  . Lipid panel    Order Specific Question:   Has the patient fasted?     Answer:   Yes  . VITAMIN D 25 Hydroxy (Vit-D Deficiency, Fractures)  . Hepatitis C Antibody  . POCT Microscopic Urinalysis ([facility])  . POCT urinalysis dipstick    Meds ordered this encounter  Medications  . Zoster Vaccine Live, PF, (ZOSTAVAX) 29562 UNT/0.65ML injection    Sig: Inject 19,400 Units into the skin once.    Dispense:  1 vial    Refill:  0      Delman Cheadle, M.D.  [facility] [address] [place], [place] [address] [phone] phone [phone] fax  01/16/16 7:46 AM

## 2015-12-30 LAB — PAP IG W/ RFLX HPV ASCU

## 2015-12-30 LAB — COMPREHENSIVE METABOLIC PANEL
ALK PHOS: 63 U/L (ref 33–130)
ALT: 18 U/L (ref 6–29)
AST: 15 U/L (ref 10–35)
Albumin: 4.1 g/dL (ref 3.6–5.1)
BILIRUBIN TOTAL: 0.6 mg/dL (ref 0.2–1.2)
BUN: 15 mg/dL (ref 7–25)
CO2: 26 mmol/L (ref 20–31)
Calcium: 9 mg/dL (ref 8.6–10.4)
Chloride: 108 mmol/L (ref 98–110)
Creat: 0.83 mg/dL (ref 0.50–0.99)
GLUCOSE: 101 mg/dL — AB (ref 65–99)
Potassium: 4.2 mmol/L (ref 3.5–5.3)
Sodium: 142 mmol/L (ref 135–146)
Total Protein: 6.3 g/dL (ref 6.1–8.1)

## 2015-12-30 LAB — LIPID PANEL
Cholesterol: 205 mg/dL — ABNORMAL HIGH (ref 125–200)
HDL: 59 mg/dL (ref >=46)
LDL Cholesterol: 132 mg/dL — ABNORMAL HIGH (ref <130)
Total CHOL/HDL Ratio: 3.5 Ratio (ref <=5.0)
Triglycerides: 70 mg/dL (ref <150)
VLDL: 14 mg/dL (ref <30)

## 2015-12-30 LAB — VITAMIN D 25 HYDROXY (VIT D DEFICIENCY, FRACTURES): VIT D 25 HYDROXY: 28 ng/mL — AB (ref 30–100)

## 2015-12-30 LAB — HEPATITIS C ANTIBODY: HCV AB: NEGATIVE

## 2016-01-11 ENCOUNTER — Encounter: Payer: Self-pay | Admitting: Family Medicine

## 2016-02-13 ENCOUNTER — Encounter: Payer: Self-pay | Admitting: Family Medicine

## 2016-06-01 ENCOUNTER — Ambulatory Visit (INDEPENDENT_AMBULATORY_CARE_PROVIDER_SITE_OTHER): Payer: BC Managed Care – PPO | Admitting: Physician Assistant

## 2016-06-01 VITALS — BP 100/70 | HR 85 | Temp 98.4°F | Resp 18 | Ht 66.0 in | Wt 206.8 lb

## 2016-06-01 DIAGNOSIS — J069 Acute upper respiratory infection, unspecified: Secondary | ICD-10-CM

## 2016-06-01 DIAGNOSIS — J029 Acute pharyngitis, unspecified: Secondary | ICD-10-CM | POA: Diagnosis not present

## 2016-06-01 DIAGNOSIS — B370 Candidal stomatitis: Secondary | ICD-10-CM

## 2016-06-01 LAB — POCT CBC
Granulocyte percent: 41.8 %G (ref 37–80)
HEMATOCRIT: 42.8 % (ref 37.7–47.9)
HEMOGLOBIN: 14.9 g/dL (ref 12.2–16.2)
LYMPH, POC: 1.9 (ref 0.6–3.4)
MCH: 30.7 pg (ref 27–31.2)
MCHC: 34.9 g/dL (ref 31.8–35.4)
MCV: 88 fL (ref 80–97)
MID (cbc): 0.4 (ref 0–0.9)
MPV: 8.1 fL (ref 0–99.8)
POC GRANULOCYTE: 1.7 — AB (ref 2–6.9)
POC LYMPH PERCENT: 47.8 %L (ref 10–50)
POC MID %: 10.4 % (ref 0–12)
Platelet Count, POC: 224 10*3/uL (ref 142–424)
RBC: 4.87 M/uL (ref 4.04–5.48)
RDW, POC: 14.1 %
WBC: 4 10*3/uL — AB (ref 4.6–10.2)

## 2016-06-01 LAB — POCT RAPID STREP A (OFFICE): Rapid Strep A Screen: NEGATIVE

## 2016-06-01 LAB — POCT SKIN KOH: Skin KOH, POC: POSITIVE — AB

## 2016-06-01 MED ORDER — BENZONATATE 100 MG PO CAPS: 100.0000 mg | ORAL_CAPSULE | Freq: Three times a day (TID) | ORAL | 0 refills | Status: DC | PRN

## 2016-06-01 MED ORDER — CLOTRIMAZOLE 10 MG MT TROC: 10.0000 mg | Freq: Every day | OROMUCOSAL | 0 refills | Status: AC

## 2016-06-01 NOTE — Progress Notes (Signed)
MRN: [id number] DOB: [birth year]  Subjective:   Caroline Bullock is a 61 y.o. female presenting for chief complaint of Thrush (noticed thrush on her tongue; ); Sore Throat (has coughing/had a fever x2 days ago ); and Eye Problem (states her left eye starting bleeding around the lower lid area) .  Reports 4 history of dry cough, scratchy throat, low grade fever, and thrush on tongue.  Has tried sudafed, netipot, with mild relief. Deniese  ear pain, wheezing, shortness of breath, chest pain and myalgia, nausea, vomiting, abdominal pain and diarrhea. 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 Has history of seasonal allergies, no history of asthma. Patient has not had flu shot this season. Denies smoking.   Also noticed bleeding from her lower eyelid this morning. Has associated bump on lower eyelid. Denies blurred vision, photophobia, foreign body sensation, direct trauma to eye, and purulent drainage. She does not wear contacts.    Caroline Bullock has a current medication list which includes the following prescription(s): clotrimazole. Also is allergic to benadryl [diphenhydramine hcl]; fish-derived products; penicillins; shellfish allergy; and tramadol.  Caroline Bullock  has a past medical history of Acid indigestion; Allergy; Anxiety; Benign tumor (11/2011); Dental crowns present; and PONV (postoperative nausea and vomiting). Also  has a past surgical history that includes Tonsillectomy; Cesarean section; Cholecystectomy; Wisdom tooth extraction; Mass excision (12/20/2011); and Tubal ligation.   Objective:   Vitals: BP 100/70   Pulse 85   Temp 98.4 F (36.9 C) (Oral)   Resp 18   Ht 5\' 6"  (1.676 m)   Wt 206 lb 12.8 oz (93.8 kg)   SpO2 97%   BMI 33.38 kg/m   Physical Exam  Constitutional: She is oriented to person, place, and time. She appears well-developed and well-nourished. No distress.  HENT:  Head: Normocephalic and atraumatic.  Right Ear: Tympanic membrane, external ear and ear  canal normal.  Left Ear: Tympanic membrane, external ear and ear canal normal.  Nose: Nose normal. Right sinus exhibits no maxillary sinus tenderness and no frontal sinus tenderness. Left sinus exhibits no maxillary sinus tenderness and no frontal sinus tenderness.  Mouth/Throat: Uvula is midline. Posterior oropharyngeal erythema present. Tonsils are 0 on the right. Tonsils are 0 on the left.  Creamy white lesions noted on tongue.   Eyes: Conjunctivae and EOM are normal. Pupils are equal, round, and reactive to light. Left eye exhibits hordeolum (on lateral aspect of lower eyelid).  Visual fields of left eye normal.   Neck: Normal range of motion.  Cardiovascular: Normal rate, regular rhythm and normal heart sounds.   Pulmonary/Chest: Effort normal and breath sounds normal.  Neurological: She is alert and oriented to person, place, and time.  Skin: Skin is warm and dry.  Psychiatric: She has a normal mood and affect.  Vitals reviewed.   Results for orders placed or performed in visit on 06/01/16 (from the past 24 hour(s))  POCT Skin KOH     Status: Abnormal   Collection Time: 06/01/16 12:36 PM  Result Value Ref Range   Skin KOH, POC Positive (A) Negative  POCT CBC     Status: Abnormal   Collection Time: 06/01/16 12:50 PM  Result Value Ref Range   WBC 4.0 (A) 4.6 - 10.2 K/uL   Lymph, poc 1.9 0.6 - 3.4   POC LYMPH PERCENT 47.8 10 - 50 %L   MID (cbc) 0.4 0 - 0.9   POC MID % 10.4 0 - 12 %M  POC Granulocyte 1.7 (A) 2 - 6.9   Granulocyte percent 41.8 37 - 80 %G   RBC 4.87 4.04 - 5.48 M/uL   Hemoglobin 14.9 12.2 - 16.2 g/dL   HCT, POC 42.8 37.7 - 47.9 %   MCV 88.0 80 - 97 fL   MCH, POC 30.7 27 - 31.2 pg   MCHC 34.9 31.8 - 35.4 g/dL   RDW, POC 14.1 %   Platelet Count, POC 224 142 - 424 K/uL   MPV 8.1 0 - 99.8 fL  POCT rapid strep A     Status: None   Collection Time: 06/01/16 12:54 PM  Result Value Ref Range   Rapid Strep A Screen Negative Negative    Assessment and Plan :  1.  Sore throat - POCT rapid strep A - Culture, Group A Strep  2. Oral thrush Labs pending. Instructed to RTC if no improvement with current tx.  - Hemoglobin A1c - HIV antibody - POCT Skin KOH - POCT CBC - clotrimazole (MYCELEX) 10 MG troche; Take 1 tablet (10 mg total) by mouth 5 (five) times daily.  Dispense: 70 tablet; Refill: 0  3. Acute upper respiratory infection -History and PE consistent with viral etiology, will treat symptomatically. Pt instructed to return to clinic if symptoms worsen, do not improve in 7-10 days, or as needed - benzonatate (TESSALON) 100 MG capsule; Take 1-2 capsules (100-200 mg total) by mouth 3 (three) times daily as needed for cough.  Dispense: 40 capsule; Refill: 0  Tenna Delaine, PA-C  [facility] 06/01/2016 1:08 PM

## 2016-06-01 NOTE — Patient Instructions (Addendum)
For stye:  -Apply warm compress to the affected eye for 10-15 minutes 4-5 times during the day until symptoms improve.  -Avoid aggressively rubbing or cleansing of the eye.  -UseJohnson's baby shampoo to wash the affected area.  -If no improvement in 2-3 days, return for referral to opthalmologist. -If you develop fever, chills,or inability to move eye, seek medical care immediately.  For thrush:  Use lozenges as prescribed, I will contact you with your lab work today. If no improvement in 2 weeks, return to clinic.  - It also appears you are having a respiratory viral infection.  - I recommend you rest, drink plenty of fluids, eat light meals including soups.  - You may use  Tessalon pearls during the day.   - You may also use Tylenol or ibuprofen over-the-counter for your sore throat.  - Please let me know if you are not seeing any improvement or get worse in 7-10 days.     Oral Thrush, Adult Oral thrush is an infection in your mouth and throat. It causes white patches on your tongue and in your mouth. Follow these instructions at home: Helping with soreness  To lessen your pain:  Drink cold liquids, like water and iced tea.  Eat frozen ice pops or frozen juices.  Eat foods that are easy to swallow, like gelatin and ice cream.  Drink from a straw if the patches in your mouth are painful. General instructions  Take or use over-the-counter and prescription medicines only as told by your doctor. Medicine for oral thrush may be something to swallow, or it may be something to put on the infected area.  Eat plain yogurt that has live cultures in it. Read the label to make sure.  If you wear dentures:  Take out your dentures before you go to bed.  Brush them well.  Soak them in a denture cleaner.  Rinse your mouth with warm salt-water many times a day. To make the salt-water mixture, completely dissolve 1/2-1 teaspoon of salt in 1 cup of warm water. Contact a doctor  if:  Your problems are getting worse.  Your problems do not get better in less than 7 days with treatment.  Your infection is spreading. This may show as white patches on the skin outside of your mouth.  You are nursing your baby and you have redness and pain in the nipples. This information is not intended to replace advice given to you by your health care provider. Make sure you discuss any questions you have with your health care provider. Document Released: 06/27/2009 Document Revised: 12/26/2015 Document Reviewed: 12/26/2015 Elsevier Interactive Patient Education  2017 [employer].    IF you received an x-ray today, you will receive an invoice from [facility]. Please contact [facility] at [phone] with questions or concerns regarding your invoice.   IF you received labwork today, you will receive an invoice from [facility]. Please contact [facility] at [phone] with questions or concerns regarding your invoice.   Our billing staff will not be able to assist you with questions regarding bills from these companies.  You will be contacted with the lab results as soon as they are available. The fastest way to get your results is to activate your My Chart account. Instructions are located on the last page of this paperwork. If you have not heard from [place] regarding the results in 2 weeks, please contact this office.

## 2016-06-02 LAB — HEMOGLOBIN A1C
Est. average glucose Bld gHb Est-mCnc: 120 mg/dL
HEMOGLOBIN A1C: 5.8 % — AB (ref 4.8–5.6)

## 2016-06-02 LAB — HIV ANTIBODY (ROUTINE TESTING W REFLEX): HIV SCREEN 4TH GENERATION: NONREACTIVE

## 2016-06-03 LAB — CULTURE, GROUP A STREP: STREP A CULTURE: NEGATIVE

## 2016-06-06 ENCOUNTER — Encounter: Payer: Self-pay | Admitting: Emergency Medicine

## 2016-09-13 DIAGNOSIS — G8929 Other chronic pain: Secondary | ICD-10-CM | POA: Insufficient documentation

## 2016-09-13 DIAGNOSIS — M542 Cervicalgia: Secondary | ICD-10-CM | POA: Insufficient documentation

## 2016-10-18 ENCOUNTER — Ambulatory Visit (INDEPENDENT_AMBULATORY_CARE_PROVIDER_SITE_OTHER): Payer: BC Managed Care – PPO

## 2016-10-18 ENCOUNTER — Encounter: Payer: Self-pay | Admitting: Family Medicine

## 2016-10-18 ENCOUNTER — Ambulatory Visit (INDEPENDENT_AMBULATORY_CARE_PROVIDER_SITE_OTHER): Payer: BC Managed Care – PPO | Admitting: Family Medicine

## 2016-10-18 VITALS — BP 114/64 | HR 88 | Temp 98.6°F | Resp 16 | Ht 66.0 in | Wt 198.0 lb

## 2016-10-18 DIAGNOSIS — M25571 Pain in right ankle and joints of right foot: Secondary | ICD-10-CM

## 2016-10-18 DIAGNOSIS — S92351A Displaced fracture of fifth metatarsal bone, right foot, initial encounter for closed fracture: Secondary | ICD-10-CM | POA: Diagnosis not present

## 2016-10-18 NOTE — Patient Instructions (Addendum)
Take Aleve 2 pills twice daily (220 mg 2) and/or Tylenol (acetaminophen) 500 mg 2 pills 3 times daily if needed for pain  Ice, elevate.  Wear the orthopedic boot. Try to avoid any weightbearing by using the crutches.  We will attempt to make a referral to the orthopedic office first thing tomorrow morning. If you have not heard from anybody by midday please call Sharee Pimple at [phone].    IF you received an x-ray today, you will receive an invoice from [facility]. Please contact [facility] Rad[facility] at [phone] with questions or concerns regarding your invoice.   IF you received labwork today, you will receive an invoice from [facility]. Please contact [facility] at [phone] with questions or concerns regarding your invoice.   Our billing staff will not be able to assist you with questions regarding bills from these companies.  You will be contacted with the lab results as soon as they are available. The fastest way to get your results is to activate your My Chart account. Instructions are located on the last page of this paperwork. If you have not heard from [place] regarding the results in 2 weeks, please contact this office.

## 2016-10-18 NOTE — Progress Notes (Signed)
Patient ID: Caroline Bullock, female    DOB: 10-Jan-1956  Age: 61 y.o. MRN: [id number]  Chief Complaint  Patient presents with  . Foot Injury    RIght foot injury this morning, swelling/pain/bruising     Subjective:  Patient is usually active, has done hiking this summer already. 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 Today she walked to her chair to sit down and turned her foot as she was getting there. She did not fall, but flopped down into the chair with a painful foot. She iced and elevated it. She has had a fractured that ankle when she was young and some years ago had broken her fourth metatarsal. She is otherwise healthy.  Current allergies, medications, problem list, past/family and social histories reviewed.  Objective:  BP 114/64   Pulse 88   Temp 98.6 F (37 C) (Oral)   Resp 16   Ht 5\' 6"  (1.676 m)   Wt 198 lb (89.8 kg)   SpO2 98%   BMI 31.96 kg/m   Ecchymosis lateral aspect of right foot mid foot area. She is very tender. Ankle is nontender. The toe is nontender. The area of the proximal metatarsal as well as the most tenderness is.  X-rays were reviewed and reading is still pending. She has a proximal metatarsal fracture.   IMPRESSION: There is an acute minimally distracted transversely oriented fracture through the base of the fifth metatarsal. Assessment & Plan:   Assessment: 1. Closed displaced fracture of fifth metatarsal bone of right foot, initial encounter   2. Pain in joint involving right ankle and foot       Plan: Orthopedic boot and crutches and referral to orthopedist  Orders Placed This Encounter  Procedures  . DG Foot Complete Right    Standing Status:   Future    Number of Occurrences:   1    Standing Expiration Date:   10/18/2017    Order Specific Question:   Reason for Exam (SYMPTOM  OR DIAGNOSIS REQUIRED)    Answer:   Turned foot hurts lateral midfoot/proximal 5th metatarsal    Order Specific Question:   Preferred imaging  location?    Answer:   External  . Ambulatory referral to Orthopedic Surgery    Referral Priority:   Urgent    Referral Type:   Surgical    Referral Reason:   Specialty Services Required    Requested Specialty:   Orthopedic Surgery    Number of Visits Requested:   1    No orders of the defined types were placed in this encounter.        Patient Instructions   Take Aleve 2 pills twice daily (220 mg 2) and/or Tylenol (acetaminophen) 500 mg 2 pills 3 times daily if needed for pain  Ice, elevate.  Wear the orthopedic boot. Try to avoid any weightbearing by using the crutches.  We will attempt to make a referral to the orthopedic office first thing tomorrow morning. If you have not heard from anybody by midday please call Sharee Pimple at [phone].    IF you received an x-ray today, you will receive an invoice from [facility] Radiology. Please contact [facility] Radiology at [phone] with questions or concerns regarding your invoice.   IF you received labwork today, you will receive an invoice from [facility]. Please contact [facility] at [phone] with questions or concerns regarding your invoice.   Our billing staff will not be able to assist you with questions regarding bills from  these companies.  You will be contacted with the lab results as soon as they are available. The fastest way to get your results is to activate your My Chart account. Instructions are located on the last page of this paperwork. If you have not heard from [place] regarding the results in 2 weeks, please contact this office.          Return if symptoms worsen or fail to improve.   Gurtaj Ruz, MD 10/18/2016

## 2017-02-25 LAB — HM MAMMOGRAPHY

## 2017-06-24 ENCOUNTER — Encounter: Payer: Self-pay | Admitting: Family Medicine

## 2017-06-24 ENCOUNTER — Ambulatory Visit: Payer: BC Managed Care – PPO | Admitting: Family Medicine

## 2017-06-24 VITALS — BP 117/65 | HR 109 | Temp 98.9°F | Resp 16 | Ht 66.0 in | Wt 195.0 lb

## 2017-06-24 DIAGNOSIS — J029 Acute pharyngitis, unspecified: Secondary | ICD-10-CM | POA: Diagnosis not present

## 2017-06-24 LAB — POCT RAPID STREP A (OFFICE): Rapid Strep A Screen: NEGATIVE

## 2017-06-24 NOTE — Progress Notes (Signed)
Subjective:  By signing my name below, I, Moises Blood, attest that this documentation has been prepared under the direction and in the presence of Merri Ray, MD. Electronically Signed: Moises Blood, Mary Esther. 06/24/2017 , 2:13 PM .  Patient was seen in Room 12 .   Patient ID: Caroline Bullock, female    DOB: Dec 07, 1955, 62 y.o.   MRN: [id number] Chief Complaint  Patient presents with  . Sinusitis    x 3 days  . Sore Throat    x 3 days  . Fatigue    feeling little energy  . Cough   HPI Caroline Bullock is a 62 y.o. female Patient complains of cough with sore throat, sinus pressure and feeling fatigue for the past 3 days. She also mentions having low grade fever Tmax 99.8 with chills yesterday. She informs having a few weeks of allergies, to mold and mildew; believes possibly from the building she works in. She also notes having some abdominal pain as well, but improved.  She took OTC medication for pain, and Sudafed, as well as drinking plenty of hot tea. She denies nausea, vomiting or body aches.   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   Patient Active Problem List   Diagnosis Date Noted  . Closed displaced fracture of fifth metatarsal bone of right foot 10/18/2016  . Acid indigestion 12/29/2015   Past Medical History:  Diagnosis Date  . Acid indigestion    rare - TUMS as needed  . Allergy   . Anxiety    no current meds.  . 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 Dental crowns present    also a lower dental implant  . PONV (postoperative nausea and vomiting)    Past Surgical History:  Procedure Laterality Date  . CESAREAN SECTION     x 3  . CHOLECYSTECTOMY    . MASS EXCISION  12/20/2011   Procedure: EXCISION MASS;  Surgeon: Wylene Simmer, MD;  Location: [facility];  Service: Orthopedics;  Laterality: Right;  . TONSILLECTOMY    . TUBAL LIGATION    . WISDOM TOOTH EXTRACTION     Allergies  Allergen Reactions  . Benadryl  [Diphenhydramine Hcl] Hives  . Fish-Derived Products Nausea And Vomiting  . Penicillins Hives  . Shellfish Allergy Nausea And Vomiting  . Tramadol Nausea And Vomiting   Prior to Admission medications   Not on File   Social History   Socioeconomic History  . Marital status: Married    Spouse name: Not on file  . Number of children: Not on file  . Years of education: Not on file  . Highest education level: Not on file  Social Needs  . Financial resource strain: Not on file  . Food insecurity - worry: Not on file  . Food insecurity - inability: Not on file  . Transportation needs - medical: Not on file  . Transportation needs - non-medical: Not on file  Occupational History  . Not on file  Tobacco Use  . Smoking status: Never Smoker  . Smokeless tobacco: Never Used  Substance and Sexual Activity  . Alcohol use: Yes    Alcohol/week: 1.8 - 3.0 oz    Types: 3 - 5 Glasses of wine per week    Comment: glass wine 2 x/week  . Drug use: No  . Sexual activity: Yes    Birth control/protection: None  Other Topics Concern  . Not on file  Social History Narrative   Married. Education: [employer]. Exercise: Yes   Review of Systems  Constitutional: Positive for chills, fatigue and fever. Negative for unexpected weight change.  HENT: Positive for sinus pressure and sore throat.   Respiratory: Positive for cough.   Gastrointestinal: Positive for abdominal pain. Negative for constipation, diarrhea, nausea and vomiting.  Musculoskeletal: Negative for myalgias.  Skin: Negative for rash and wound.  Neurological: Negative for dizziness, weakness and headaches.       Objective:   Physical Exam  Constitutional: She is oriented to person, place, and time. She appears well-developed and well-nourished. No distress.  HENT:  Head: Normocephalic and atraumatic.  Right Ear: Hearing, tympanic membrane, external ear and ear canal normal.  Left Ear: Hearing, tympanic membrane, external ear and ear  canal normal.  Nose: Right sinus exhibits maxillary sinus tenderness (worse than left). Right sinus exhibits no frontal sinus tenderness. Left sinus exhibits maxillary sinus tenderness. Left sinus exhibits no frontal sinus tenderness.  Mouth/Throat: Oropharynx is clear and moist. No oropharyngeal exudate.  Eyes: Conjunctivae and EOM are normal. Pupils are equal, round, and reactive to light.  Cardiovascular: Normal rate, regular rhythm, normal heart sounds and intact distal pulses.  No murmur heard. Pulmonary/Chest: Effort normal and breath sounds normal. No respiratory distress. She has no wheezes. She has no rhonchi.  Lymphadenopathy:    She has no cervical adenopathy.  Neurological: She is alert and oriented to person, place, and time.  Skin: Skin is warm and dry. No rash noted.  Psychiatric: She has a normal mood and affect. Her behavior is normal.  Vitals reviewed.   Vitals:   06/24/17 1317  BP: 117/65  Pulse: (!) 109  Resp: 16  Temp: 98.9 F (37.2 C)  TempSrc: Oral  SpO2: 96%  Weight: 195 lb (88.5 kg)  Height: 5\' 6"  (1.676 m)   Results for orders placed or performed in visit on 06/24/17  POCT rapid strep A  Result Value Ref Range   Rapid Strep A Screen Negative Negative        Assessment & Plan:   Caroline Bullock is a 62 y.o. female Sore throat - Plan: POCT rapid strep A  - suspected viral URI. Reassuring exam.   - symptomatic care for now, but if not improving into following week, would consider antibiotic for sinusitis at that time. rtc precautions if worse.   No orders of the defined types were placed in this encounter.  Patient Instructions    Saline nasal spray atleast 4 times per day, over the counter mucinex or mucinex DM for cough. drink plenty of fluids.  Return to clinic if worsening.  If your sinus pressure/pain is not improving into next week, call and I will send an antibiotic. If you are having fevers or worsening symptoms, please return sooner for  recheck.   Return to the clinic or go to the nearest emergency room if any of your symptoms worsen or new symptoms occur.   Upper Respiratory Infection, Adult Most upper respiratory infections (URIs) are caused by a virus. A URI affects the nose, throat, and upper air passages. The most common type of URI is often called "the common cold." Follow these instructions at home:  Take medicines only as told by your doctor.  Gargle warm saltwater or take cough drops to comfort your throat as told by your doctor.  Use a warm mist humidifier or inhale steam from a shower to increase air moisture. This may make it easier to  breathe.  Drink enough fluid to keep your pee (urine) clear or pale yellow.  Eat soups and other clear broths.  Have a healthy diet.  Rest as needed.  Go back to work when your fever is gone or your doctor says it is okay. ? You may need to stay home longer to avoid giving your URI to others. ? You can also wear a face mask and wash your hands often to prevent spread of the virus.  Use your inhaler more if you have asthma.  Do not use any tobacco products, including cigarettes, chewing tobacco, or electronic cigarettes. If you need help quitting, ask your doctor. Contact a doctor if:  You are getting worse, not better.  Your symptoms are not helped by medicine.  You have chills.  You are getting more short of breath.  You have brown or red mucus.  You have yellow or brown discharge from your nose.  You have pain in your face, especially when you bend forward.  You have a fever.  You have puffy (swollen) neck glands.  You have pain while swallowing.  You have white areas in the back of your throat. Get help right away if:  You have very bad or constant: ? Headache. ? Ear pain. ? Pain in your forehead, behind your eyes, and over your cheekbones (sinus pain). ? Chest pain.  You have long-lasting (chronic) lung disease and any of the  following: ? Wheezing. ? Long-lasting cough. ? Coughing up blood. ? A change in your usual mucus.  You have a stiff neck.  You have changes in your: ? Vision. ? Hearing. ? Thinking. ? Mood. This information is not intended to replace advice given to you by your health care provider. Make sure you discuss any questions you have with your health care provider. Document Released: 09/19/2007 Document Revised: 12/04/2015 Document Reviewed: 07/08/2013 Elsevier Interactive Patient Education  2018 [employer].      IF you received an x-ray today, you will receive an invoice from [facility]. Please contact [facility] at [phone] with questions or concerns regarding your invoice.   IF you received labwork today, you will receive an invoice from [facility]. Please contact [facility] at [phone] with questions or concerns regarding your invoice.   Our billing staff will not be able to assist you with questions regarding bills from these companies.  You will be contacted with the lab results as soon as they are available. The fastest way to get your results is to activate your My Chart account. Instructions are located on the last page of this paperwork. If you have not heard from [place] regarding the results in 2 weeks, please contact this office.      I personally performed the services described in this documentation, which was scribed in my presence. The recorded information has been reviewed and considered for accuracy and completeness, addended by me as needed, and agree with information above.  Signed,   Merri Ray, MD [facility].  06/26/17 5:03 PM

## 2017-06-24 NOTE — Patient Instructions (Signed)
Saline nasal spray atleast 4 times per day, over the counter mucinex or mucinex DM for cough. drink plenty of fluids.  Return to clinic if worsening.  If your sinus pressure/pain is not improving into next week, call and I will send an antibiotic. If you are having fevers or worsening symptoms, please return sooner for recheck.   Return to the clinic or go to the nearest emergency room if any of your symptoms worsen or new symptoms occur.   Upper Respiratory Infection, Adult Most upper respiratory infections (URIs) are caused by a virus. A URI affects the nose, throat, and upper air passages. The most common type of URI is often called "the common cold." Follow these instructions at home:  Take medicines only as told by your doctor.  Gargle warm saltwater or take cough drops to comfort your throat as told by your doctor.  Use a warm mist humidifier or inhale steam from a shower to increase air moisture. This may make it easier to breathe.  Drink enough fluid to keep your pee (urine) clear or pale yellow.  Eat soups and other clear broths.  Have a healthy diet.  Rest as needed.  Go back to work when your fever is gone or your doctor says it is okay. ? You may need to stay home longer to avoid giving your URI to others. ? You can also wear a face mask and wash your hands often to prevent spread of the virus.  Use your inhaler more if you have asthma.  Do not use any tobacco products, including cigarettes, chewing tobacco, or electronic cigarettes. If you need help quitting, ask your doctor. Contact a doctor if:  You are getting worse, not better.  Your symptoms are not helped by medicine.  You have chills.  You are getting more short of breath.  You have brown or red mucus.  You have yellow or brown discharge from your nose.  You have pain in your face, especially when you bend forward.  You have a fever.  You have puffy (swollen) neck glands.  You have pain while  swallowing.  You have white areas in the back of your throat. Get help right away if:  You have very bad or constant: ? Headache. ? Ear pain. ? Pain in your forehead, behind your eyes, and over your cheekbones (sinus pain). ? Chest pain.  You have long-lasting (chronic) lung disease and any of the following: ? Wheezing. ? Long-lasting cough. ? Coughing up blood. ? A change in your usual mucus.  You have a stiff neck.  You have changes in your: ? Vision. ? Hearing. ? Thinking. ? Mood. This information is not intended to replace advice given to you by your health care provider. Make sure you discuss any questions you have with your health care provider. Document Released: 09/19/2007 Document Revised: 12/04/2015 Document Reviewed: 07/08/2013 Elsevier Interactive Patient Education  2018 [employer].      IF you received an x-ray today, you will receive an invoice from [facility]. Please contact [facility] at [phone] with questions or concerns regarding your invoice.   IF you received labwork today, you will receive an invoice from [facility]. Please contact [facility] at [phone] with questions or concerns regarding your invoice.   Our billing staff will not be able to assist you with questions regarding bills from these companies.  You will be contacted with the lab results as soon as they are available. The fastest way to get your  results is to activate your My Chart account. Instructions are located on the last page of this paperwork. If you have not heard from [place] regarding the results in 2 weeks, please contact this office.

## 2018-03-12 ENCOUNTER — Encounter: Payer: Self-pay | Admitting: Family Medicine

## 2019-03-18 LAB — HM MAMMOGRAPHY

## 2019-04-03 ENCOUNTER — Encounter: Payer: Self-pay | Admitting: *Deleted

## 2019-05-14 IMAGING — DX DG FOOT COMPLETE 3+V*R*
3 series · 3 of 3 positions shown · non-contrast
Comparison: None in PACs

CLINICAL DATA: Inversion injury of the foot today with persistent
pain over the base of the fifth metatarsal.

EXAM:
RIGHT FOOT COMPLETE - 3+ VIEW

[foot ap]
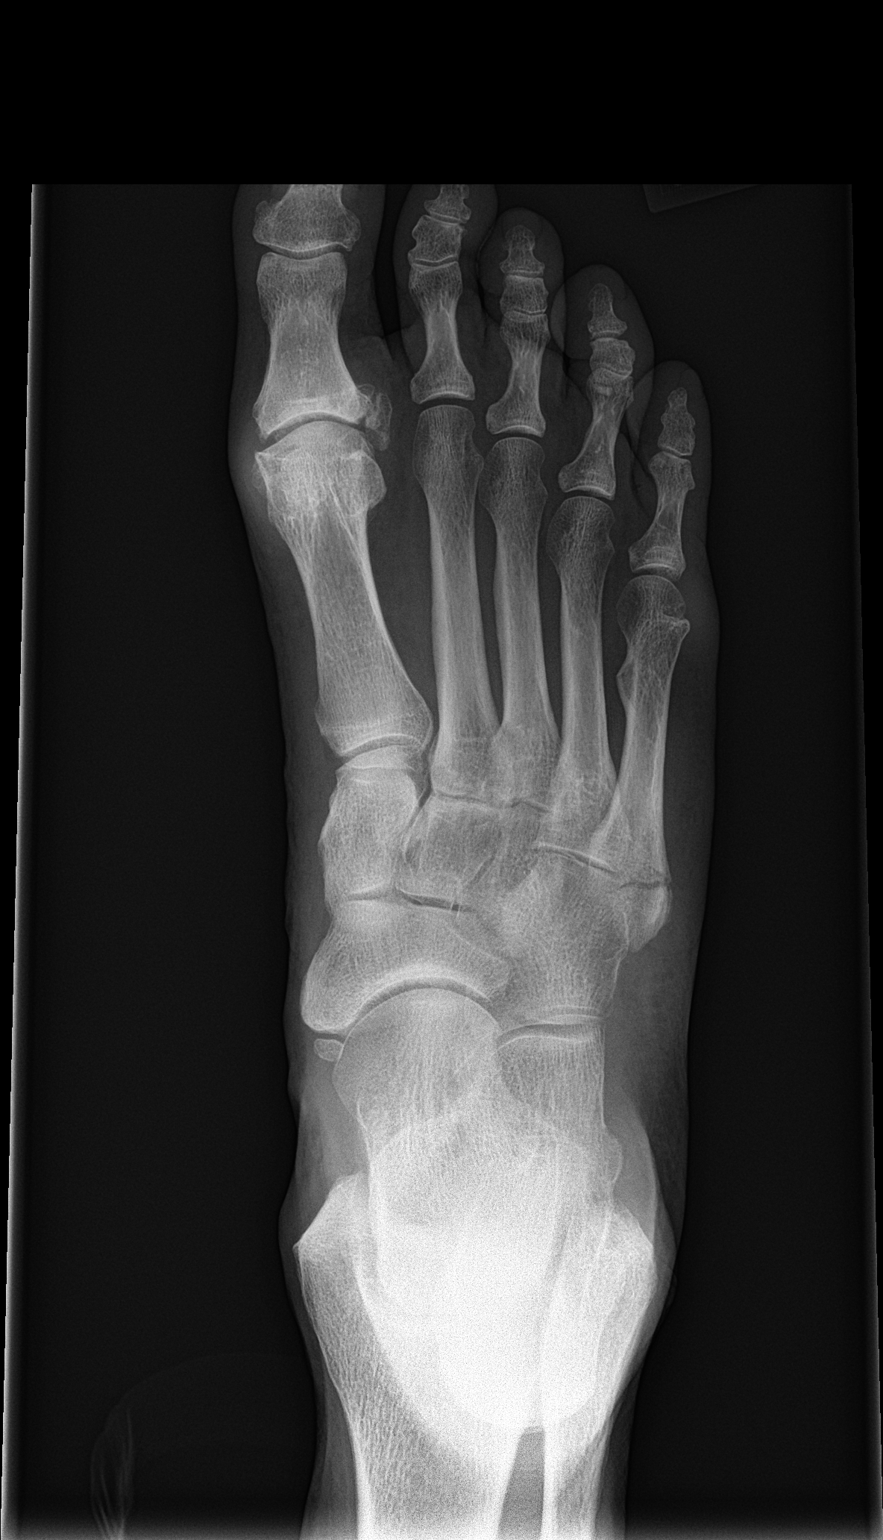

[foot obl]
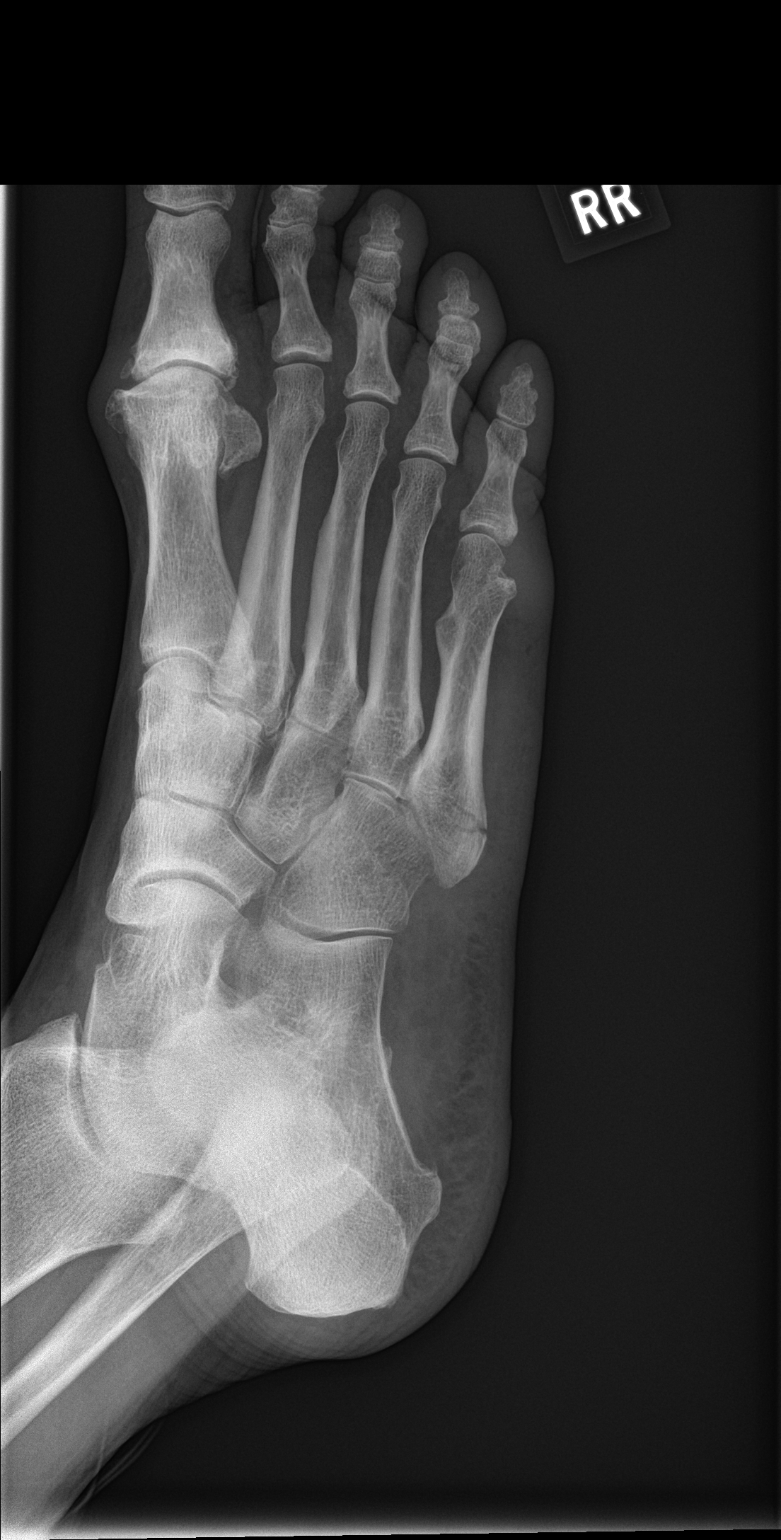

[foot lat]
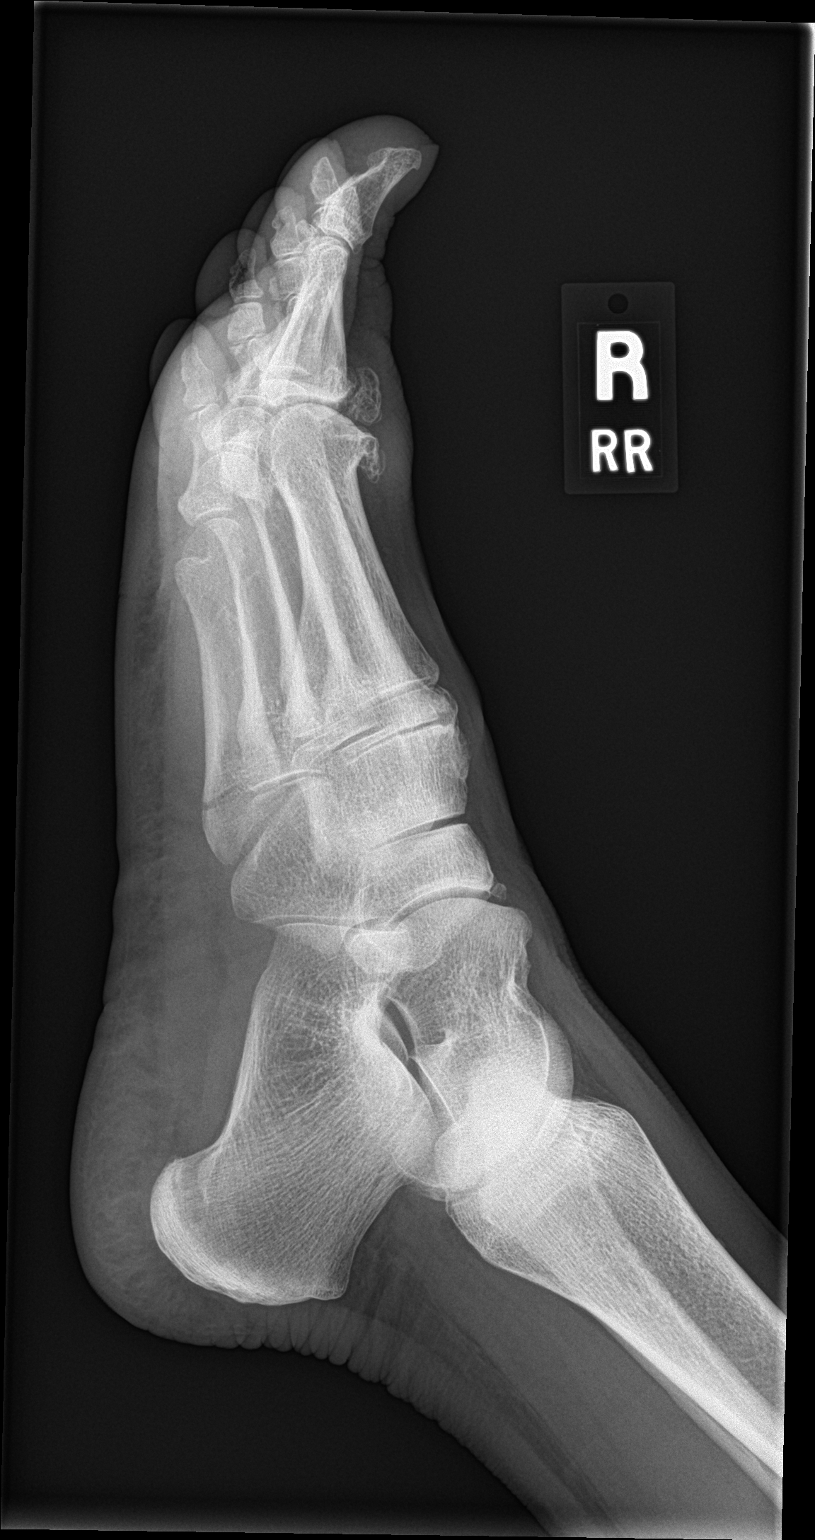

[3 of 3 positions shown; findings below may reference images not displayed]

FINDINGS: There is an acute transversely oriented minimally distracted
fracture through the base of the fifth metatarsal. There is old
deformity of the distal aspect of the fifth metatarsal. The other
metatarsals are intact. There are degenerative changes of the first
MTP joint. The phalanges are intact. The tarsal bones appear intact.
IMPRESSION: There is an acute minimally distracted transversely oriented
fracture through the base of the fifth metatarsal.

## 2019-05-17 ENCOUNTER — Ambulatory Visit: Payer: BC Managed Care – PPO

## 2019-05-23 ENCOUNTER — Ambulatory Visit: Payer: BC Managed Care – PPO

## 2019-05-23 ENCOUNTER — Ambulatory Visit: Payer: Medicare PPO | Attending: Internal Medicine

## 2019-05-23 DIAGNOSIS — Z23 Encounter for immunization: Secondary | ICD-10-CM | POA: Insufficient documentation

## 2019-05-23 NOTE — Progress Notes (Signed)
   Covid-19 Vaccination Clinic  Name:  Caroline Bullock    MRN: [id number] DOB: 1955-09-28  05/23/2019  [name] was observed post Covid-19 immunization for 15 minutes without incidence. She was provided with Vaccine Information Sheet and instruction to access the V-Safe system.   [name] was instructed to call 911 with any severe reactions post vaccine: Marland Kitchen Difficulty breathing  . Swelling of your face and throat  . A fast heartbeat  . A bad rash all over your body  . Dizziness and weakness    Immunizations Administered    Name Date Dose VIS Date Route   Pfizer COVID-19 Vaccine 05/23/2019  9:44 AM 0.3 mL 03/27/2019 Intramuscular   Manufacturer: [place]   Lot: [id number]   [place]: [id number]

## 2019-05-28 ENCOUNTER — Ambulatory Visit: Payer: BC Managed Care – PPO

## 2019-06-16 ENCOUNTER — Ambulatory Visit: Payer: Medicare PPO

## 2019-06-16 ENCOUNTER — Ambulatory Visit: Payer: Medicare PPO | Attending: Internal Medicine

## 2019-06-16 DIAGNOSIS — Z23 Encounter for immunization: Secondary | ICD-10-CM | POA: Insufficient documentation

## 2019-06-16 NOTE — Progress Notes (Signed)
   Covid-19 Vaccination Clinic  Name:  Caroline Bullock    MRN: [id number] DOB: 07-11-1955  06/16/2019  [name] was observed post Covid-19 immunization for 15 minutes without incident. She was provided with Vaccine Information Sheet and instruction to access the V-Safe system.   [name] was instructed to call 911 with any severe reactions post vaccine: Marland Kitchen Difficulty breathing  . Swelling of face and throat  . A fast heartbeat  . A bad rash all over body  . Dizziness and weakness   Immunizations Administered    Name Date Dose VIS Date Route   Pfizer COVID-19 Vaccine 06/16/2019  1:59 PM 0.3 mL 03/27/2019 Intramuscular   Manufacturer: [place]   Lot: [id number]   [place]: [id number]

## 2019-08-25 ENCOUNTER — Ambulatory Visit (INDEPENDENT_AMBULATORY_CARE_PROVIDER_SITE_OTHER): Payer: Medicare PPO | Admitting: Family Medicine

## 2019-08-25 ENCOUNTER — Telehealth: Payer: Self-pay | Admitting: Family Medicine

## 2019-08-25 ENCOUNTER — Other Ambulatory Visit: Payer: Self-pay

## 2019-08-25 ENCOUNTER — Encounter: Payer: Self-pay | Admitting: Family Medicine

## 2019-08-25 VITALS — BP 122/70 | HR 97 | Temp 97.7°F | Ht 66.0 in | Wt 209.0 lb

## 2019-08-25 DIAGNOSIS — Z86010 Personal history of colonic polyps: Secondary | ICD-10-CM

## 2019-08-25 DIAGNOSIS — Z6833 Body mass index (BMI) 33.0-33.9, adult: Secondary | ICD-10-CM | POA: Diagnosis not present

## 2019-08-25 DIAGNOSIS — Z780 Asymptomatic menopausal state: Secondary | ICD-10-CM | POA: Diagnosis not present

## 2019-08-25 NOTE — Addendum Note (Signed)
Addended by: Rutherford Guys on: 08/25/2019 09:48 AM   Modules accepted: Orders

## 2019-08-25 NOTE — Telephone Encounter (Signed)
08/25/2019 - PATIENT HAS A COMPLETE PHYSICAL SCHEDULED WITH DR. Benay Spice ON Monday 09/07/2019. DR. Benay Spice WANTS HER TO COME IN BEFORE HER PHYSICAL TO GET HER LABS DONE. THIS MESSAGE IS TO GET THE LAB ORDERS PLACED. [place]

## 2019-08-25 NOTE — Progress Notes (Addendum)
5/11/20219:17 AM  [place] [birth year], 64 y.o., female [id number]  Chief Complaint  Patient presents with  . Transitions Of Care    HPI:   Patient is a 64 y.o. female who presents today to establish care  Previous PCP Dr Brigitte Pulse, last CPE 2017 Has had several acute care visits with collegues Has no acute concerns today  Last pap in 2017 Last [facility]o oct 2020, [facility] 2014, [facility] [place], + 2 adenomatous polyps, repeat in 5 years Walks/does yoga Mostly plant based diet Does not smoke Rare etoh Retired Education officer, museum Married, 3 adult children that live out of state  Health Maintenance Due  Topic Date Due  . DEXA SCAN  Never done    Depression screen [facility] 2/9 08/25/2019 06/24/2017 10/18/2016  Decreased Interest 0 0 0  Down, Depressed, Hopeless 0 0 0  PHQ - 2 Score 0 0 0    Fall Risk  08/25/2019 06/24/2017 10/18/2016 06/01/2016 12/29/2015  Falls in the past year? 0 No No No No  Number falls in past yr: 0 - - - -  Injury with Fall? 0 - - - -  Follow up Falls evaluation completed - - - -     Allergies  Allergen Reactions  . Benadryl [Diphenhydramine Hcl] Hives  . Fish-Derived Products Nausea And Vomiting  . Penicillins Hives  . Shellfish Allergy Nausea And Vomiting  . Tramadol Nausea And Vomiting  has never been challenged with cephalosporins Unsure if allergy to benadryl vs dye present  Prior to Admission medications   Not on File    Past Medical History:  Diagnosis Date  . Acid indigestion    rare - TUMS as needed  . Allergy   . Anxiety    no current meds.  . 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 Dental crowns present    also a lower dental implant  . PONV (postoperative nausea and vomiting)     Past Surgical History:  Procedure Laterality Date  . CESAREAN SECTION     x 3  . CHOLECYSTECTOMY    . MASS EXCISION  12/20/2011   Procedure: EXCISION MASS;  Surgeon: Wylene Simmer, MD;  Location: [facility];   Service: Orthopedics;  Laterality: Right;  . TONSILLECTOMY    . TUBAL LIGATION    . WISDOM TOOTH EXTRACTION      Social History   Tobacco Use  . Smoking status: Never Smoker  . Smokeless tobacco: Never Used  Substance Use Topics  . Alcohol use: Yes    Alcohol/week: 3.0 - 5.0 standard drinks    Types: 3 - 5 Glasses of wine per week    Comment: glass wine 2 x/week    Family History  Problem Relation Age of Onset  . Cancer Mother        spot on lung  . Diabetes Mother   . Heart disease Father        vascular  . Diabetes Sister   . Diabetes Maternal Grandmother   . Heart disease Maternal Grandmother     Review of Systems  Constitutional: Negative for chills and fever.  Respiratory: Negative for cough and shortness of breath.   Cardiovascular: Negative for chest pain, palpitations and leg swelling.  Gastrointestinal: Negative for abdominal pain, nausea and vomiting.     OBJECTIVE:  Today's Vitals   08/25/19 0854  BP: 122/70  Pulse: 97  Temp: 97.7 F (36.5 C)  SpO2: 99%  Weight: 209  lb (94.8 kg)  Height: 5\' 6"  (1.676 m)   Body mass index is 33.73 kg/m.   Wt Readings from Last 3 Encounters:  08/25/19 209 lb (94.8 kg)  06/24/17 195 lb (88.5 kg)  10/18/16 198 lb (89.8 kg)     Physical Exam Vitals and nursing note reviewed.  Constitutional:      Appearance: She is well-developed.  HENT:     Head: Normocephalic and atraumatic.     Mouth/Throat:     Pharynx: No oropharyngeal exudate.  Eyes:     General: No scleral icterus.    Conjunctiva/sclera: Conjunctivae normal.     Pupils: Pupils are equal, round, and reactive to light.  Cardiovascular:     Rate and Rhythm: Normal rate and regular rhythm.     Heart sounds: Normal heart sounds. No murmur. No friction rub. No gallop.   Pulmonary:     Effort: Pulmonary effort is normal.     Breath sounds: Normal breath sounds. No wheezing or rales.  Musculoskeletal:     Cervical back: Neck supple.  Skin:     General: Skin is warm and dry.  Neurological:     Mental Status: She is alert and oriented to person, place, and time.     No results found for this or any previous visit (from the past 24 hour(s)).  No results found.   ASSESSMENT and PLAN  1. History of colonic polyps - Ambulatory referral to Gastroenterology  2. Postmenopausal estrogen deficiency - DG Bone Density; Future  3. BMI 33.0-33.9,adult Discussed LFM - TSH, CMP, Lipids - future, fasting  PMH, PSH, meds, allergies, Fhx, Shx, reviewed with patient today.  Return for as scheduled.    Rutherford Guys, MD [facility] [place], [place] [address] Ph.  [phone] Fax [phone]

## 2019-08-25 NOTE — Telephone Encounter (Signed)
Order for this pt have already been added for future draw.  Thanks

## 2019-09-02 ENCOUNTER — Other Ambulatory Visit: Payer: Self-pay

## 2019-09-02 ENCOUNTER — Ambulatory Visit (INDEPENDENT_AMBULATORY_CARE_PROVIDER_SITE_OTHER): Payer: Medicare PPO | Admitting: Emergency Medicine

## 2019-09-02 DIAGNOSIS — Z6833 Body mass index (BMI) 33.0-33.9, adult: Secondary | ICD-10-CM

## 2019-09-02 NOTE — Progress Notes (Signed)
Lab only visit 

## 2019-09-03 LAB — COMPREHENSIVE METABOLIC PANEL
ALT: 16 IU/L (ref 0–32)
AST: 20 IU/L (ref 0–40)
Albumin/Globulin Ratio: 2 (ref 1.2–2.2)
Albumin: 4.3 g/dL (ref 3.8–4.8)
Alkaline Phosphatase: 88 IU/L (ref 48–121)
BUN/Creatinine Ratio: 16 (ref 12–28)
BUN: 12 mg/dL (ref 8–27)
Bilirubin Total: 0.3 mg/dL (ref 0.0–1.2)
CO2: 21 mmol/L (ref 20–29)
Calcium: 9.5 mg/dL (ref 8.7–10.3)
Chloride: 104 mmol/L (ref 96–106)
Creatinine, Ser: 0.73 mg/dL (ref 0.57–1.00)
GFR calc Af Amer: 99 mL/min/{1.73_m2} (ref >59)
GFR calc non Af Amer: 86 mL/min/{1.73_m2} (ref >59)
Globulin, Total: 2.2 g/dL (ref 1.5–4.5)
Glucose: 103 mg/dL — ABNORMAL HIGH (ref 65–99)
Potassium: 4.4 mmol/L (ref 3.5–5.2)
Sodium: 138 mmol/L (ref 134–144)
Total Protein: 6.5 g/dL (ref 6.0–8.5)

## 2019-09-03 LAB — LIPID PANEL
Chol/HDL Ratio: 3.2 ratio (ref 0.0–4.4)
Cholesterol, Total: 177 mg/dL (ref 100–199)
HDL: 56 mg/dL (ref >39)
LDL Chol Calc (NIH): 108 mg/dL — ABNORMAL HIGH (ref 0–99)
Triglycerides: 69 mg/dL (ref 0–149)
VLDL Cholesterol Cal: 13 mg/dL (ref 5–40)

## 2019-09-03 LAB — TSH: TSH: 2.39 u[IU]/mL (ref 0.450–4.500)

## 2019-09-07 ENCOUNTER — Encounter: Payer: Self-pay | Admitting: Family Medicine

## 2019-09-07 ENCOUNTER — Other Ambulatory Visit (HOSPITAL_COMMUNITY)
Admission: RE | Admit: 2019-09-07 | Discharge: 2019-09-07 | Disposition: A | Discharge status: Home / Self Care | Payer: Medicare PPO | Source: Ambulatory Visit | Attending: Family Medicine | Admitting: Family Medicine

## 2019-09-07 ENCOUNTER — Ambulatory Visit (INDEPENDENT_AMBULATORY_CARE_PROVIDER_SITE_OTHER): Payer: Medicare PPO | Admitting: Family Medicine

## 2019-09-07 ENCOUNTER — Other Ambulatory Visit: Payer: Self-pay

## 2019-09-07 VITALS — BP 123/77

## 2019-09-07 DIAGNOSIS — Z0000 Encounter for general adult medical examination without abnormal findings: Secondary | ICD-10-CM

## 2019-09-07 DIAGNOSIS — Z01419 Encounter for gynecological examination (general) (routine) without abnormal findings: Secondary | ICD-10-CM | POA: Diagnosis present

## 2019-09-07 DIAGNOSIS — Z780 Asymptomatic menopausal state: Secondary | ICD-10-CM | POA: Diagnosis not present

## 2019-09-07 DIAGNOSIS — Z1151 Encounter for screening for human papillomavirus (HPV): Secondary | ICD-10-CM | POA: Diagnosis not present

## 2019-09-07 NOTE — Patient Instructions (Addendum)
   If you have lab work done today you will be contacted with your lab results within the next 2 weeks.  If you have not heard from us then please contact us. The fastest way to get your results is to register for My Chart.   IF you received an x-ray today, you will receive an invoice from [facility]. Please contact [facility] at [phone] with questions or concerns regarding your invoice.   IF you received labwork today, you will receive an invoice from [facility]. Please contact [facility] at [phone] with questions or concerns regarding your invoice.   Our billing staff will not be able to assist you with questions regarding bills from these companies.  You will be contacted with the lab results as soon as they are available. The fastest way to get your results is to activate your My Chart account. Instructions are located on the last page of this paperwork. If you have not heard from us regarding the results in 2 weeks, please contact this office.       Preventive Care 66 Years and Older, Female Preventive care refers to lifestyle choices and visits with your health care provider that can promote health and wellness. This includes:  A yearly physical exam. This is also called an annual well check.  Regular dental and eye exams.  Immunizations.  Screening for certain conditions.  Healthy lifestyle choices, such as diet and exercise. What can I expect for my preventive care visit? Physical exam Your health care provider will check:  Height and weight. These may be used to calculate body mass index (BMI), which is a measurement that tells if you are at a healthy weight.  Heart rate and blood pressure.  Your skin for abnormal spots. Counseling Your health care provider may ask you questions about:  Alcohol, tobacco, and drug use.  Emotional well-being.  Home and relationship well-being.  Sexual activity.  Eating habits.  History of  falls.  Memory and ability to understand (cognition).  Work and work environment.  Pregnancy and menstrual history. What immunizations do I need?  Influenza (flu) vaccine  This is recommended every year. Tetanus, diphtheria, and pertussis (Tdap) vaccine  You may need a Td booster every 10 years. Varicella (chickenpox) vaccine  You may need this vaccine if you have not already been vaccinated. Zoster (shingles) vaccine  You may need this after age 60. Pneumococcal conjugate (PCV13) vaccine  One dose is recommended after age 65. Pneumococcal polysaccharide (PPSV23) vaccine  One dose is recommended after age 65. Measles, mumps, and rubella (MMR) vaccine  You may need at least one dose of MMR if you were born in 1957 or later. You may also need a second dose. Meningococcal conjugate (MenACWY) vaccine  You may need this if you have certain conditions. Hepatitis A vaccine  You may need this if you have certain conditions or if you travel or work in places where you may be exposed to hepatitis A. Hepatitis B vaccine  You may need this if you have certain conditions or if you travel or work in places where you may be exposed to hepatitis B. Haemophilus influenzae type b (Hib) vaccine  You may need this if you have certain conditions. You may receive vaccines as individual doses or as more than one vaccine together in one shot (combination vaccines). Talk with your health care provider about the risks and benefits of combination vaccines. What tests do I need? Blood tests  Lipid and cholesterol   levels. These may be checked every 5 years, or more frequently depending on your overall health.  Hepatitis C test.  Hepatitis B test. Screening  Lung cancer screening. You may have this screening every year starting at age 55 if you have a 30-pack-year history of smoking and currently smoke or have quit within the past 15 years.  Colorectal cancer screening. All adults should  have this screening starting at age 50 and continuing until age 75. Your health care provider may recommend screening at age 45 if you are at increased risk. You will have tests every 1-10 years, depending on your results and the type of screening test.  Diabetes screening. This is done by checking your blood sugar (glucose) after you have not eaten for a while (fasting). You may have this done every 1-3 years.  Mammogram. This may be done every 1-2 years. Talk with your health care provider about how often you should have regular mammograms.  BRCA-related cancer screening. This may be done if you have a family history of breast, ovarian, tubal, or peritoneal cancers. Other tests  Sexually transmitted disease (STD) testing.  Bone density scan. This is done to screen for osteoporosis. You may have this done starting at age 65. Follow these instructions at home: Eating and drinking  Eat a diet that includes fresh fruits and vegetables, whole grains, lean protein, and low-fat dairy products. Limit your intake of foods with high amounts of sugar, saturated fats, and salt.  Take vitamin and mineral supplements as recommended by your health care provider.  Do not drink alcohol if your health care provider tells you not to drink.  If you drink alcohol: ? Limit how much you have to 0-1 drink a day. ? Be aware of how much alcohol is in your drink. In the [place] one drink equals one 12 oz bottle of beer (355 mL), one 5 oz glass of wine (148 mL), or one 1 oz glass of hard liquor (44 mL). Lifestyle  Take daily care of your teeth and gums.  Stay active. Exercise for at least 30 minutes on 5 or more days each week.  Do not use any products that contain nicotine or tobacco, such as cigarettes, e-cigarettes, and chewing tobacco. If you need help quitting, ask your health care provider.  If you are sexually active, practice safe sex. Use a condom or other form of protection in order to prevent STIs  (sexually transmitted infections).  Talk with your health care provider about taking a low-dose aspirin or statin. What's next?  Go to your health care provider once a year for a well check visit.  Ask your health care provider how often you should have your eyes and teeth checked.  Stay up to date on all vaccines. This information is not intended to replace advice given to you by your health care provider. Make sure you discuss any questions you have with your health care provider. Document Revised: 03/27/2018 Document Reviewed: 03/27/2018 Elsevier Patient Education  2020 Elsevier Inc.  

## 2019-09-07 NOTE — Progress Notes (Signed)
Presents today for [employer] Visit-Initial.   Date of last exam: last CPE 2017  Interpreter used for this visit? n/a  Patient Care Team: Rutherford Guys, MD as PCP - General (Family Medicine)   Other items to address today: none  Cancer Screening: Cervical: last pap in 2017, denies h/o abnormal Breast: dec 2020 Colon: due, referral made may 2021   Other Screening: Last screening for diabetes: may 2021 Last lipid screening: may 2021 Dexa: ordered may 2021  Lab Results  Component Value Date   CHOL 177 09/02/2019   HDL 56 09/02/2019   LDLCALC 108 (H) 09/02/2019   TRIG 69 09/02/2019   CHOLHDL 3.2 09/02/2019     ADVANCE DIRECTIVES: Discussed: yes On File: no, patient will bring in her documents   Immunization status:  Immunization History  Administered Date(s) Administered  . Influenza-Unspecified 02/15/2019  . PFIZER SARS-COV-2 Vaccination 05/23/2019, 06/16/2019  . Pneumococcal Conjugate-13 01/29/2019  . Tdap 12/28/2011  . Zoster 02/19/2016     Health Maintenance Due  Topic Date Due  . DEXA SCAN  Never done     Functional Status Survey: Is the patient deaf or have difficulty hearing?: No Does the patient have difficulty seeing, even when wearing glasses/contacts?: No Does the patient have difficulty concentrating, remembering, or making decisions?: No Does the patient have difficulty walking or climbing stairs?: No Does the patient have difficulty dressing or bathing?: No Does the patient have difficulty doing errands alone such as visiting a doctor's office or shopping?: No  6CIT Screen 09/07/2019  What Year? 0 points  What month? 0 points  What time? 0 points  Count back from 20 0 points  Months in reverse 0 points  Repeat phrase 0 points  Total Score 0     Home Environment: safe, no fall risks  Urinary Incontinence Screening: denies any concerns  Patient Active Problem List   Diagnosis Date Noted  . Closed displaced  fracture of fifth metatarsal bone of right foot 10/18/2016  . Chronic bilateral low back pain without sciatica 09/13/2016  . Neck pain 09/13/2016  . Acid indigestion 12/29/2015     Past Medical History:  Diagnosis Date  . Acid indigestion    rare - TUMS as needed  . Allergy   . Anxiety    no current meds.  . 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 Dental crowns present    also a lower dental implant  . PONV (postoperative nausea and vomiting)      Past Surgical History:  Procedure Laterality Date  . CESAREAN SECTION     x 3  . CHOLECYSTECTOMY    . MASS EXCISION  12/20/2011   Procedure: EXCISION MASS;  Surgeon: Wylene Simmer, MD;  Location: [facility];  Service: Orthopedics;  Laterality: Right;  . TONSILLECTOMY    . TUBAL LIGATION    . WISDOM TOOTH EXTRACTION       Family History  Problem Relation Age of Onset  . Cancer Mother        spot on lung  . Diabetes Mother   . Heart disease Father        vascular  . Diabetes Sister   . Diabetes Maternal Grandmother   . Heart disease Maternal Grandmother      Social History   Socioeconomic History  . Marital status: Married    Spouse name: Not on file  . Number of children: Not on file  . Years of  education: Not on file  . Highest education level: Not on file  Occupational History  . Not on file  Tobacco Use  . Smoking status: Never Smoker  . Smokeless tobacco: Never Used  Substance and Sexual Activity  . Alcohol use: Yes    Alcohol/week: 3.0 - 5.0 standard drinks    Types: 3 - 5 Glasses of wine per week    Comment: glass wine 2 x/week  . Drug use: No  . Sexual activity: Yes    Birth control/protection: None  Other Topics Concern  . Not on file  Social History Narrative   Married. Education: [employer]. Exercise: Yes   Social Determinants of Health   Financial Resource Strain:   . Difficulty of Paying Living Expenses:   Food Insecurity:   . Worried About Charity fundraiser in the Last Year:     . Arboriculturist in the Last Year:   Transportation Needs:   . Film/video editor (Medical):   Marland Kitchen Lack of Transportation (Non-Medical):   Physical Activity:   . Days of Exercise per Week:   . Minutes of Exercise per Session:   Stress:   . Feeling of Stress :   Social Connections:   . Frequency of Communication with Friends and Family:   . Frequency of Social Gatherings with Friends and Family:   . Attends Religious Services:   . Active Member of Clubs or Organizations:   . Attends Archivist Meetings:   Marland Kitchen Marital Status:   Intimate Partner Violence:   . Fear of Current or Ex-Partner:   . Emotionally Abused:   Marland Kitchen Physically Abused:   . Sexually Abused:      Allergies  Allergen Reactions  . Benadryl [Diphenhydramine Hcl] Hives  . Fish-Derived Products Nausea And Vomiting  . Penicillins Hives  . Shellfish Allergy Nausea And Vomiting  . Tramadol Nausea And Vomiting     Prior to Admission medications   Not on File     Depression screen [facility] 2/9 08/25/2019 06/24/2017 10/18/2016 06/01/2016 12/29/2015  Decreased Interest 0 0 0 0 0  Down, Depressed, Hopeless 0 0 0 0 0  PHQ - 2 Score 0 0 0 0 0     Fall Risk  09/07/2019 08/25/2019 06/24/2017 10/18/2016 06/01/2016  Falls in the past year? 0 0 No No No  Number falls in past yr: 0 0 - - -  Injury with Fall? 0 0 - - -  Follow up - Falls evaluation completed - - -     PHYSICAL EXAM: BP 123/77   Wt Readings from Last 3 Encounters:  08/25/19 209 lb (94.8 kg)  06/24/17 195 lb (88.5 kg)  10/18/16 198 lb (89.8 kg)     Hearing Screening   125Hz  250Hz  500Hz  1000Hz  2000Hz  3000Hz  4000Hz  6000Hz  8000Hz   Right ear:           Left ear:             Visual Acuity Screening   Right eye Left eye Both eyes  Without correction: 20/40 20/40 20/25   With correction:       Physical Exam  Constitutional: She is oriented to person, place, and time and well-developed, well-nourished, and in no distress.  HENT:  Head:  Normocephalic and atraumatic.  Mouth/Throat: Mucous membranes are normal.  Eyes: Pupils are equal, round, and reactive to light. EOM are normal. No scleral icterus.  Pulmonary/Chest: Effort normal. Right breast exhibits no mass, no nipple discharge and no  skin change. Left breast exhibits no mass, no nipple discharge and no skin change.  Genitourinary:    Cervix and uterus normal.  Rectum:     External hemorrhoid present.  Uterus is not enlarged, not fixed and not tender.    Cervix is not fixed.  Cervix exhibits no motion tenderness and no lesion. Right adnexum displays no mass and no tenderness. Left adnexum displays no mass and no tenderness.    No vulval lesion, erythema or rash noted.     No vaginal discharge, rugosity, mucosa abnormailty or exudate.     No lesions in the vagina.   Musculoskeletal:     Cervical back: Neck supple.  Lymphadenopathy:    She has no axillary adenopathy.       Right axillary: No pectoral and no lateral adenopathy present.       Left axillary: No pectoral and no lateral adenopathy present.      Right: No supraclavicular adenopathy present.       Left: No supraclavicular adenopathy present.  Neurological: She is alert and oriented to person, place, and time. Gait normal.  Skin: Skin is warm and dry.  Psychiatric: Mood and affect normal.  Nursing note and vitals reviewed. chaperone present  Education/Counseling provided regarding diet and exercise, prevention of chronic diseases, smoking/tobacco cessation if appropriate, and reviewed "Covered Medicare Preventive Services"  ASSESSMENT/PLAN:  1. Encounter for Medicare annual wellness exam 2. Encounter for gynecological examination without abnormal finding No concerns per history or exam. HCM reviewed/discussed. Anticipatory guidance regarding healthy weight, lifestyle and choices given.  - Cytology - PAP

## 2019-09-07 NOTE — Progress Notes (Deleted)
Presents today for [employer] Visit-Subsequent.   Date of last exam: ***  Interpreter used for this visit? {Yes/No-Ex:120004}  Patient Care Team: Rutherford Guys, MD as PCP - General (Family Medicine)   Other items to address today: {NONE DEFAULTED:18576::"none"} ***  Cancer Screening: Cervical: last pap 2017 Breast: dec 2020 Colon: referral made to GI  Other Screening: Last screening for diabetes: may 2021 Last lipid screening: may 2021  ADVANCE DIRECTIVES: Discussed: {Yes/No-Ex:120004} Patient desires CPR ({YES/NO:21197}), mechanical ventilation ({YES/NO:21197}), prolonged artificial support (may include mechanical ventilation, tube/PEG feeding, etc) ({YES/NO:21197}). On File: {Yes/No-Ex:120004} Materials Provided: {Yes/No-Ex:120004}  Immunization status:  Immunization History  Administered Date(s) Administered  . Influenza-Unspecified 02/15/2019  . PFIZER SARS-COV-2 Vaccination 05/23/2019, 06/16/2019  . Pneumococcal Conjugate-13 01/29/2019  . Tdap 12/28/2011  . Zoster 02/19/2016     Health Maintenance Due  Topic Date Due  . DEXA SCAN  Never done     Functional Status Survey: Is the patient deaf or have difficulty hearing?: No Does the patient have difficulty seeing, even when wearing glasses/contacts?: No Does the patient have difficulty concentrating, remembering, or making decisions?: No Does the patient have difficulty walking or climbing stairs?: No Does the patient have difficulty dressing or bathing?: No Does the patient have difficulty doing errands alone such as visiting a doctor's office or shopping?: No   Home Environment: ***  Urinary Incontinence Screening: ***  Patient Active Problem List   Diagnosis Date Noted  . Closed displaced fracture of fifth metatarsal bone of right foot 10/18/2016  . Chronic bilateral low back pain without sciatica 09/13/2016  . Neck pain 09/13/2016  . Acid indigestion 12/29/2015      Past Medical History:  Diagnosis Date  . Acid indigestion    rare - TUMS as needed  . Allergy   . Anxiety    no current meds.  . 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 Dental crowns present    also a lower dental implant  . PONV (postoperative nausea and vomiting)      Past Surgical History:  Procedure Laterality Date  . CESAREAN SECTION     x 3  . CHOLECYSTECTOMY    . MASS EXCISION  12/20/2011   Procedure: EXCISION MASS;  Surgeon: Wylene Simmer, MD;  Location: [facility];  Service: Orthopedics;  Laterality: Right;  . TONSILLECTOMY    . TUBAL LIGATION    . WISDOM TOOTH EXTRACTION       Family History  Problem Relation Age of Onset  . Cancer Mother        spot on lung  . Diabetes Mother   . Heart disease Father        vascular  . Diabetes Sister   . Diabetes Maternal Grandmother   . Heart disease Maternal Grandmother      Social History   Socioeconomic History  . Marital status: Married    Spouse name: Not on file  . Number of children: Not on file  . Years of education: Not on file  . Highest education level: Not on file  Occupational History  . Not on file  Tobacco Use  . Smoking status: Never Smoker  . Smokeless tobacco: Never Used  Substance and Sexual Activity  . Alcohol use: Yes    Alcohol/week: 3.0 - 5.0 standard drinks    Types: 3 - 5 Glasses of wine per week    Comment: glass wine 2 x/week  . Drug use: No  .  Sexual activity: Yes    Birth control/protection: None  Other Topics Concern  . Not on file  Social History Narrative   Married. Education: [employer]. Exercise: Yes   Social Determinants of Health   Financial Resource Strain:   . Difficulty of Paying Living Expenses:   Food Insecurity:   . Worried About Charity fundraiser in the Last Year:   . Arboriculturist in the Last Year:   Transportation Needs:   . Film/video editor (Medical):   Marland Kitchen Lack of Transportation (Non-Medical):   Physical Activity:   . Days of  Exercise per Week:   . Minutes of Exercise per Session:   Stress:   . Feeling of Stress :   Social Connections:   . Frequency of Communication with Friends and Family:   . Frequency of Social Gatherings with Friends and Family:   . Attends Religious Services:   . Active Member of Clubs or Organizations:   . Attends Archivist Meetings:   Marland Kitchen Marital Status:   Intimate Partner Violence:   . Fear of Current or Ex-Partner:   . Emotionally Abused:   Marland Kitchen Physically Abused:   . Sexually Abused:      Allergies  Allergen Reactions  . Benadryl [Diphenhydramine Hcl] Hives  . Fish-Derived Products Nausea And Vomiting  . Penicillins Hives  . Shellfish Allergy Nausea And Vomiting  . Tramadol Nausea And Vomiting     Prior to Admission medications   Not on File     Depression screen [facility] 2/9 08/25/2019 06/24/2017 10/18/2016 06/01/2016 12/29/2015  Decreased Interest 0 0 0 0 0  Down, Depressed, Hopeless 0 0 0 0 0  PHQ - 2 Score 0 0 0 0 0     Fall Risk  09/07/2019 08/25/2019 06/24/2017 10/18/2016 06/01/2016  Falls in the past year? 0 0 No No No  Number falls in past yr: 0 0 - - -  Injury with Fall? 0 0 - - -  Follow up - Falls evaluation completed - - -      PHYSICAL EXAM: BP 123/77    Wt Readings from Last 3 Encounters:  08/25/19 209 lb (94.8 kg)  06/24/17 195 lb (88.5 kg)  10/18/16 198 lb (89.8 kg)      Hearing Screening   125Hz  250Hz  500Hz  1000Hz  2000Hz  3000Hz  4000Hz  6000Hz  8000Hz   Right ear:           Left ear:             Visual Acuity Screening   Right eye Left eye Both eyes  Without correction: 20/40 20/40 20/25   With correction:         Physical Exam   Education/Counseling provided regarding diet and exercise, prevention of chronic diseases, smoking/tobacco cessation, if applicable, and reviewed "Covered Medicare Preventive Services."   ASSESSMENT/PLAN: ***

## 2019-09-09 LAB — CYTOLOGY - PAP: Diagnosis: NEGATIVE

## 2019-10-09 ENCOUNTER — Encounter: Payer: Self-pay | Admitting: Family Medicine

## 2019-10-15 DIAGNOSIS — H524 Presbyopia: Secondary | ICD-10-CM | POA: Diagnosis not present

## 2019-12-17 ENCOUNTER — Ambulatory Visit (INDEPENDENT_AMBULATORY_CARE_PROVIDER_SITE_OTHER): Payer: Medicare PPO | Admitting: Family Medicine

## 2019-12-17 ENCOUNTER — Other Ambulatory Visit: Payer: Self-pay

## 2019-12-17 ENCOUNTER — Encounter: Payer: Self-pay | Admitting: Family Medicine

## 2019-12-17 VITALS — BP 118/75 | HR 62 | Temp 98.0°F | Ht 66.0 in | Wt 199.4 lb

## 2019-12-17 DIAGNOSIS — Z23 Encounter for immunization: Secondary | ICD-10-CM

## 2019-12-17 DIAGNOSIS — Z119 Encounter for screening for infectious and parasitic diseases, unspecified: Secondary | ICD-10-CM

## 2019-12-17 NOTE — Progress Notes (Signed)
Nurse visit

## 2019-12-17 NOTE — Patient Instructions (Addendum)
If you have lab work done today you will be contacted with your lab results within the next 2 weeks.  If you have not heard from [place] then please contact us. The fastest way to get your results is to register for My Chart.   IF you received an x-ray today, you will receive an invoice from [facility]. Please contact [facility] at [phone] with questions or concerns regarding your invoice.   IF you received labwork today, you will receive an invoice from [facility]. Please contact [facility] at [phone] with questions or concerns regarding your invoice.   Our billing staff will not be able to assist you with questions regarding bills from these companies.  You will be contacted with the lab results as soon as they are available. The fastest way to get your results is to activate your My Chart account. Instructions are located on the last page of this paperwork. If you have not heard from [place] regarding the results in 2 weeks, please contact this office.     Tuberculosis Risk Questionnaire  1. No Were you born outside the [place] in one of the following parts of the world: [place], [place], [place], [place] or [place]?    2. No Have you traveled outside the [place] and lived for more than one month in one of the following parts of the world: [place], [place], [place], [place] or [place]?    3. No Do you have a compromised immune system such as from any of the following conditions:HIV/AIDS, organ or bone marrow transplantation, diabetes, immunosuppressive medicines (e.g. Prednisone, Remicaide), leukemia, lymphoma, cancer of the head or neck, gastrectomy or jejunal bypass, end-stage renal disease (on dialysis), or silicosis?     4. Yes  Have you ever or do you plan on working in: a residential care center, a health care facility, a jail or prison or homeless shelter?    5. No Have you ever: injected illegal drugs, used crack cocaine, lived  in a homeless shelter  or been in jail or prison?     6. No Have you ever been exposed to anyone with infectious tuberculosis?  7. No Have you ever had a BCG vaccine? (BCG is a vaccine for tuberculosis  (TB) used in OTHER countries, NOT in the [place]).  8. No Have you ever been advised by a health care provider NOT to have a TB skin test?  9. No Have you ever had a POSITIVE TB skin test?  IF SO, when? n/a  IF SO, were you treated with INH? n/a  IF SO, where? n/a  Tuberculosis Symptom Questionnaire  Do you currently have any of the following symptoms?  1. No Unexplained cough lasting more than 3 weeks?   2. No Unexplained fever lasting more than 3 weeks.   3. No Night Sweats (sweating that leaves the bedclothes and sheets wet)     4. No Shortness of Breath   5. No Chest Pain   6. No Unintentional weight loss    7. No Unexplained fatigue (very tired for no reason)   Tuberculosis Risk Questionnaire  1. No Were you born outside the [place] in one of the following parts of the world: [place], [place], [place], [place] or [place]?    2. No Have you traveled outside the [place] and lived for more than one month in one of the following parts of the world: [place], [place], [place], [place] or [place]  [place]?    3. No Do you have a compromised immune system such as from any of the following conditions:HIV/AIDS, organ or bone marrow transplantation, diabetes, immunosuppressive medicines (e.g. Prednisone, Remicaide), leukemia, lymphoma, cancer of the head or neck, gastrectomy or jejunal bypass, end-stage renal disease (on dialysis), or silicosis?     4. No Have you ever or do you plan on working in: a residential care center, a health care facility, a jail or prison or homeless shelter?    5. No Have you ever: injected illegal drugs, used crack cocaine, lived in a homeless shelter  or been in jail or prison?     6. No Have you ever been exposed to anyone with  infectious tuberculosis?  7. No Have you ever had a BCG vaccine? (BCG is a vaccine for tuberculosis  (TB) used in OTHER countries, NOT in the [place]).  8. No Have you ever been advised by a health care provider NOT to have a TB skin test?  9. No Have you ever had a POSITIVE TB skin test?  IF SO, when? n/a  IF SO, were you treated with INH? n/a  IF SO, where? n/a  Tuberculosis Symptom Questionnaire  Do you currently have any of the following symptoms?  1. No Unexplained cough lasting more than 3 weeks?   2. No Unexplained fever lasting more than 3 weeks.   3. No Night Sweats (sweating that leaves the bedclothes and sheets wet)     4. No Shortness of Breath   5. No Chest Pain   6. No Unintentional weight loss    7. No Unexplained fatigue (very tired for no reason)

## 2019-12-18 ENCOUNTER — Ambulatory Visit: Payer: Self-pay

## 2019-12-18 LAB — MEASLES/MUMPS/RUBELLA IMMUNITY
MUMPS ABS, IGG: >300 AU/mL (ref >10.9)
RUBEOLA AB, IGG: >300 AU/mL (ref >16.4)
Rubella Antibodies, IGG: 24 index (ref >0.99)

## 2019-12-18 LAB — HEPATITIS B SURFACE ANTIBODY, QUANTITATIVE: Hepatitis B Surf Ab Quant: <3.1 m[IU]/mL — ABNORMAL LOW (ref >9.9)

## 2019-12-22 ENCOUNTER — Other Ambulatory Visit: Payer: Self-pay

## 2019-12-22 ENCOUNTER — Ambulatory Visit (INDEPENDENT_AMBULATORY_CARE_PROVIDER_SITE_OTHER): Payer: Medicare PPO | Admitting: Family Medicine

## 2019-12-22 DIAGNOSIS — Z111 Encounter for screening for respiratory tuberculosis: Secondary | ICD-10-CM

## 2019-12-24 ENCOUNTER — Ambulatory Visit: Payer: Medicare PPO

## 2019-12-24 ENCOUNTER — Other Ambulatory Visit: Payer: Self-pay

## 2019-12-24 LAB — TB SKIN TEST
Induration: 0 mm
TB Skin Test: NEGATIVE

## 2020-03-30 ENCOUNTER — Encounter: Payer: Self-pay | Admitting: Family Medicine

## 2020-09-11 ENCOUNTER — Ambulatory Visit (HOSPITAL_COMMUNITY)
Admission: EM | Admit: 2020-09-11 | Discharge: 2020-09-11 | Disposition: A | Discharge status: Home / Self Care | Payer: Medicare PPO | Attending: Medical Oncology | Admitting: Medical Oncology

## 2020-09-11 ENCOUNTER — Other Ambulatory Visit: Payer: Self-pay

## 2020-09-11 ENCOUNTER — Encounter (HOSPITAL_COMMUNITY): Payer: Self-pay

## 2020-09-11 DIAGNOSIS — H8111 Benign paroxysmal vertigo, right ear: Secondary | ICD-10-CM

## 2020-09-11 MED ORDER — FLUTICASONE PROPIONATE 50 MCG/ACT NA SUSP: 2.0000 | Freq: Every day | NASAL | 0 refills | Status: DC

## 2020-09-11 MED ORDER — MECLIZINE HCL 12.5 MG PO TABS: 12.5000 mg | ORAL_TABLET | Freq: Three times a day (TID) | ORAL | 0 refills | Status: DC | PRN

## 2020-09-11 NOTE — ED Triage Notes (Signed)
Pt present dizziness, symptoms started 3 days ago. Pt states the first time dizziness started was when she was getting started to do yoga, then when she woke up the next morning.  Pt states it might be some fluid in her ears and some popping.

## 2020-09-11 NOTE — ED Provider Notes (Signed)
[facility]    CSN: [id number] Arrival date & time: 09/11/20  1535      History   Chief Complaint Chief Complaint  Patient presents with  . Dizziness         HPI Caroline Bullock is a 65 y.o. female.   HPI   Dizziness: Patient states that for the last 3 days she has had some dizziness.  She states that the dizziness only occurs with positional head movements.  She states that the first dizziness episode occurred when she bent over to do a yoga position.  She states that she stood up and the room physically appeared to spin for a few seconds.  She found a chair nearby and was able to breathe through the dizziness.  She had mild nausea associated with it but denied any double vision, syncope, chest pain, shortness of breath, anxiety, palpitations, tinnitus.  She has tried avoiding these positions with some improvement.  She has read about benign positional vertigo which she suspects she has and is concerned that she might have some middle ear effusion that needs to be treated to help with her symptoms.  Of note no head trauma, fevers, cold symptoms.   Past Medical History:  Diagnosis Date  . Acid indigestion    rare - TUMS as needed  . Allergy   . Anxiety    no current meds.  . 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 Dental crowns present    also a lower dental implant  . PONV (postoperative nausea and vomiting)     Patient Active Problem List   Diagnosis Date Noted  . Closed displaced fracture of fifth metatarsal bone of right foot 10/18/2016  . Chronic bilateral low back pain without sciatica 09/13/2016  . Neck pain 09/13/2016  . Acid indigestion 12/29/2015    Past Surgical History:  Procedure Laterality Date  . CESAREAN SECTION     x 3  . CHOLECYSTECTOMY    . MASS EXCISION  12/20/2011   Procedure: EXCISION MASS;  Surgeon: Wylene Simmer, MD;  Location: [place];  Service: Orthopedics;  Laterality: Right;  . TONSILLECTOMY    . TUBAL LIGATION    .  WISDOM TOOTH EXTRACTION      OB History   No obstetric history on file.      Home Medications    Prior to Admission medications   Medication Sig Start Date End Date Taking? Authorizing Provider  fluticasone (FLONASE) 50 MCG/ACT nasal spray Place 2 sprays into both nostrils daily. 09/11/20  Yes Maryclare Nydam M, PA-C  meclizine (ANTIVERT) 12.5 MG tablet Take 1 tablet (12.5 mg total) by mouth 3 (three) times daily as needed for dizziness. 09/11/20  Yes Hughie Closs, PA-C    Family History Family History  Problem Relation Age of Onset  . Cancer Mother        spot on lung  . Diabetes Mother   . Heart disease Father        vascular  . Diabetes Sister   . Diabetes Maternal Grandmother   . Heart disease Maternal Grandmother     Social History Social History   Tobacco Use  . Smoking status: Never Smoker  . Smokeless tobacco: Never Used  Substance Use Topics  . Alcohol use: Yes    Alcohol/week: 3.0 - 5.0 standard drinks    Types: 3 - 5 Glasses of wine per week    Comment: glass wine 2 x/week  .  Drug use: No     Allergies   Benadryl [diphenhydramine hcl], Fish-derived products, Penicillins, Shellfish allergy, and Tramadol   Review of Systems Review of Systems  As stated above in HPI Physical Exam Triage Vital Signs ED Triage Vitals  Enc Vitals Group     BP 09/11/20 1636 133/61     Pulse Rate 09/11/20 1636 77     Resp 09/11/20 1636 16     Temp 09/11/20 1636 98.9 F (37.2 C)     Temp Source 09/11/20 1636 Oral     SpO2 09/11/20 1636 100 %     Weight --      Height --      Head Circumference --      Peak Flow --      Pain Score 09/11/20 1634 0     Pain Loc --      Pain Edu? --      Excl. in [place]? --    No data found.  Updated Vital Signs BP 133/61 (BP Location: Right Arm)   Pulse 77   Temp 98.9 F (37.2 C) (Oral)   Resp 16   SpO2 100%    Physical Exam Vitals and nursing note reviewed.  Constitutional:      General: She is not in acute  distress.    Appearance: Normal appearance. She is not ill-appearing, toxic-appearing or diaphoretic.  HENT:     Head: Normocephalic and atraumatic.     Right Ear: Tympanic membrane and ear canal normal.     Left Ear: Tympanic membrane and ear canal normal.     Ears:     Comments: Moderate clear middle ear effusion bilaterally    Nose: Nose normal.  Eyes:     Extraocular Movements: Extraocular movements intact.     Pupils: Pupils are equal, round, and reactive to light.     Comments: Mild nystagmus of the right eye with EOMs. Reproducible dizziness with head tilt.   Cardiovascular:     Rate and Rhythm: Normal rate and regular rhythm.     Heart sounds: Normal heart sounds.  Pulmonary:     Effort: Pulmonary effort is normal.     Breath sounds: Normal breath sounds.  Musculoskeletal:     Cervical back: Normal range of motion and neck supple. No tenderness.  Lymphadenopathy:     Cervical: No cervical adenopathy.  Skin:    General: Skin is warm.     Coloration: Skin is not pale.  Neurological:     Mental Status: She is alert and oriented to person, place, and time.     Cranial Nerves: No cranial nerve deficit.     Sensory: No sensory deficit.     Motor: No weakness.     Coordination: Coordination normal.     Gait: Gait normal.     Deep Tendon Reflexes: Reflexes normal.  Psychiatric:        Mood and Affect: Mood normal.        Behavior: Behavior normal.        Thought Content: Thought content normal.        Judgment: Judgment normal.      UC Treatments / Results  Labs (all labs ordered are listed, but only abnormal results are displayed) Labs Reviewed - No data to display  EKG   Radiology No results found.  Procedures Procedures (including critical care time)  Medications Ordered in UC Medications - No data to display  Initial Impression / Assessment and Plan / UC  Course  I have reviewed the triage vital signs and the nursing notes.  Pertinent labs & imaging  results that were available during my care of the patient were reviewed by me and considered in my medical decision making (see chart for details).     New.  Discussed benign positional vertigo including how to reduce triggers, treatments and red flag signs and symptoms.  For now treating her with a lower dose of meclizine given her age along with Flonase.  We discussed the Epley maneuver they are and how to do this at home with a partner or to google a 1 person Epley maneuver.  We also discussed reducing fall risk. Follow up in 1 week if unimproved or sooner should symptoms worsen.  Final Clinical Impressions(s) / UC Diagnoses   Final diagnoses:  BPPV (benign paroxysmal positional vertigo), right   Discharge Instructions   None    ED Prescriptions    Medication Sig Dispense Auth. Provider   fluticasone (FLONASE) 50 MCG/ACT nasal spray Place 2 sprays into both nostrils daily. 16 mL Kennett Symes M, PA-C   meclizine (ANTIVERT) 12.5 MG tablet Take 1 tablet (12.5 mg total) by mouth 3 (three) times daily as needed for dizziness. 30 tablet Hughie Closs, [place]     PDMP not reviewed this encounter.   Hughie Closs, [place] 09/11/20 1756

## 2020-09-18 ENCOUNTER — Ambulatory Visit (HOSPITAL_COMMUNITY)
Admission: RE | Admit: 2020-09-18 | Discharge: 2020-09-18 | Disposition: A | Discharge status: Home / Self Care | Payer: Medicare PPO | Source: Ambulatory Visit | Attending: Student | Admitting: Student

## 2020-09-18 ENCOUNTER — Encounter (HOSPITAL_COMMUNITY): Payer: Self-pay

## 2020-09-18 ENCOUNTER — Other Ambulatory Visit: Payer: Self-pay

## 2020-09-18 VITALS — BP 127/79 | HR 95 | Temp 98.4°F | Resp 17

## 2020-09-18 DIAGNOSIS — H8111 Benign paroxysmal vertigo, right ear: Secondary | ICD-10-CM | POA: Diagnosis not present

## 2020-09-18 DIAGNOSIS — H65193 Other acute nonsuppurative otitis media, bilateral: Secondary | ICD-10-CM | POA: Diagnosis not present

## 2020-09-18 MED ORDER — PREDNISONE 20 MG PO TABS: 20.0000 mg | ORAL_TABLET | Freq: Every day | ORAL | 0 refills | Status: AC

## 2020-09-18 NOTE — ED Provider Notes (Signed)
[facility]    CSN: [id number] Arrival date & time: 09/18/20  1443      History   Chief Complaint Chief Complaint  Patient presents with  . Dizziness    APPT  . Otalgia    HPI BRIELLAH BAIK is a 65 y.o. female presenting with persistent dizziness x3 days. Medical history BPPV diagnosed at 5/29 visit. Was prescribed meclizine and flonase, and has attempted Epley maneuvers..  States that the dizziness has nonetheless persisted for 3 more days.  States that the first dizziness episode occurred when she bent over to do a yoga position.  When she stood up following this, the room seems to spin for a few seconds.  This passed after a few minutes.  She is having a few episodes of this every day.  They typically involve mild nausea, but no vision changes, weakness, chest pain, shortness of breath, anxiety, palpitations.  She does note that she is having some bilateral ear pressure, states she cleans some pollen and this typically triggers her allergies.  Denies weakness, chest pain, shortness of breath, fever/chills, recent URI, head trauma.  HPI  Past Medical History:  Diagnosis Date  . Acid indigestion    rare - TUMS as needed  . Allergy   . Anxiety    no current meds.  . 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 Dental crowns present    also a lower dental implant  . PONV (postoperative nausea and vomiting)     Patient Active Problem List   Diagnosis Date Noted  . Closed displaced fracture of fifth metatarsal bone of right foot 10/18/2016  . Chronic bilateral low back pain without sciatica 09/13/2016  . Neck pain 09/13/2016  . Acid indigestion 12/29/2015    Past Surgical History:  Procedure Laterality Date  . CESAREAN SECTION     x 3  . CHOLECYSTECTOMY    . MASS EXCISION  12/20/2011   Procedure: EXCISION MASS;  Surgeon: Wylene Simmer, MD;  Location: [place];  Service: Orthopedics;  Laterality: Right;  . TONSILLECTOMY    . TUBAL LIGATION    . WISDOM  TOOTH EXTRACTION      OB History   No obstetric history on file.      Home Medications    Prior to Admission medications   Medication Sig Start Date End Date Taking? Authorizing Provider  predniSONE (DELTASONE) 20 MG tablet Take 1 tablet (20 mg total) by mouth daily for 5 days. 09/18/20 09/23/20 Yes Hazel Sams, PA-C  fluticasone (FLONASE) 50 MCG/ACT nasal spray Place 2 sprays into both nostrils daily. 09/11/20   Hughie Closs, PA-C  meclizine (ANTIVERT) 12.5 MG tablet Take 1 tablet (12.5 mg total) by mouth 3 (three) times daily as needed for dizziness. 09/11/20   Hughie Closs, PA-C    Family History Family History  Problem Relation Age of Onset  . Cancer Mother        spot on lung  . Diabetes Mother   . Heart disease Father        vascular  . Diabetes Sister   . Diabetes Maternal Grandmother   . Heart disease Maternal Grandmother     Social History Social History   Tobacco Use  . Smoking status: Never Smoker  . Smokeless tobacco: Never Used  Substance Use Topics  . Alcohol use: Yes    Alcohol/week: 3.0 - 5.0 standard drinks    Types: 3 - 5 Glasses of wine  per week    Comment: glass wine 2 x/week  . Drug use: No     Allergies   Benadryl [diphenhydramine hcl], Fish-derived products, Penicillins, Shellfish allergy, and Tramadol   Review of Systems Review of Systems  Neurological: Positive for dizziness. Negative for tremors, seizures, syncope, facial asymmetry, speech difficulty, weakness, light-headedness, numbness and headaches.  All other systems reviewed and are negative.    Physical Exam Triage Vital Signs ED Triage Vitals [09/18/20 1526]  Enc Vitals Group     BP      Pulse      Resp      Temp      Temp src      SpO2      Weight      Height      Head Circumference      Peak Flow      Pain Score 3     Pain Loc      Pain Edu?      Excl. in [place]?    No data found.  Updated Vital Signs BP 127/79 (BP Location: Left Arm)   Pulse 95    Temp 98.4 F (36.9 C) (Oral)   Resp 17   SpO2 96%   Visual Acuity Right Eye Distance:   Left Eye Distance:   Bilateral Distance:    Right Eye Near:   Left Eye Near:    Bilateral Near:     Physical Exam Vitals reviewed.  Constitutional:      General: She is not in acute distress.    Appearance: Normal appearance. She is not ill-appearing.  HENT:     Head: Normocephalic and atraumatic.     Right Ear: Hearing, ear canal and external ear normal. No swelling or tenderness. A middle ear effusion is present. There is no impacted cerumen. No mastoid tenderness. Tympanic membrane is not perforated, erythematous, retracted or bulging.     Left Ear: Hearing, ear canal and external ear normal. No swelling or tenderness. A middle ear effusion is present. There is no impacted cerumen. No mastoid tenderness. Tympanic membrane is not perforated, erythematous, retracted or bulging.     Nose:     Right Sinus: No maxillary sinus tenderness or frontal sinus tenderness.     Left Sinus: No maxillary sinus tenderness or frontal sinus tenderness.     Mouth/Throat:     Mouth: Mucous membranes are moist.     Pharynx: Uvula midline. No oropharyngeal exudate or posterior oropharyngeal erythema.     Tonsils: No tonsillar exudate.  Eyes:     Extraocular Movements: Extraocular movements intact.     Pupils: Pupils are equal, round, and reactive to light.  Cardiovascular:     Rate and Rhythm: Normal rate and regular rhythm.     Heart sounds: Normal heart sounds.  Pulmonary:     Breath sounds: Normal breath sounds and air entry. No wheezing, rhonchi or rales.  Chest:     Chest wall: No tenderness.  Abdominal:     General: Abdomen is flat. Bowel sounds are normal.     Tenderness: There is no abdominal tenderness. There is no guarding or rebound.  Lymphadenopathy:     Cervical: No cervical adenopathy.  Skin:    Capillary Refill: Capillary refill takes less than 2 seconds.  Neurological:     General: No  focal deficit present.     Mental Status: She is alert and oriented to person, place, and time.     Comments: CN 2-12  grossly intact PERRLA, EOMI No nystagmus  Psychiatric:        Attention and Perception: Attention and perception normal.        Mood and Affect: Mood and affect normal.        Behavior: Behavior normal. Behavior is cooperative.        Thought Content: Thought content normal.        Judgment: Judgment normal.       UC Treatments / Results  Labs (all labs ordered are listed, but only abnormal results are displayed) Labs Reviewed - No data to display  EKG   Radiology No results found.  Procedures Procedures (including critical care time)  Medications Ordered in UC Medications - No data to display  Initial Impression / Assessment and Plan / UC Course  I have reviewed the triage vital signs and the nursing notes.  Pertinent labs & imaging results that were available during my care of the patient were reviewed by me and considered in my medical decision making (see chart for details).     This patient is a 65 year old female presenting with persistent BPPV.  She was last seen at our urgent care few days ago and was prescribed meclizine and Flonase, this is helping somewhat.  She is also attempted Epley maneuvers without success.  States that the symptoms are unchanged but she is having a little bit more ear pressure.  Suspect this is related to allergic rhinitis/spending time outside.  Exam is unchanged from last visit.  Low-dose of prednisone sent for middle ear effusion, she is not a diabetic.  Follow-up with ear nose and throat doctor, information provided.  ED return precautions discussed.  Final Clinical Impressions(s) / UC Diagnoses   Final diagnoses:  Benign paroxysmal positional vertigo of right ear  Acute effusion of both middle ears     Discharge Instructions     -Prednisone, one pill daily for 5 days. I recommend taking this in the morning as  it could give you energy.  -Continue Meclizine as needed for dizziness -Continue flonase -Follow-up with ENT doctor. Information below.  -Seek additional medical attention if you develop new symptoms like weakness in 1 arm or 1 leg, severe headaches, vision changes, new/worsening ear pain.     ED Prescriptions    Medication Sig Dispense Auth. Provider   predniSONE (DELTASONE) 20 MG tablet Take 1 tablet (20 mg total) by mouth daily for 5 days. 5 tablet Hazel Sams, PA-C     PDMP not reviewed this encounter.   Hazel Sams, PA-C 09/18/20 [phone]

## 2020-09-18 NOTE — Discharge Instructions (Signed)
-  Prednisone, one pill daily for 5 days. I recommend taking this in the morning as it could give you energy.  -Continue Meclizine as needed for dizziness -Continue flonase -Follow-up with ENT doctor. Information below.  -Seek additional medical attention if you develop new symptoms like weakness in 1 arm or 1 leg, severe headaches, vision changes, new/worsening ear pain.

## 2020-09-18 NOTE — ED Triage Notes (Signed)
Pt presents for follow-up for vertigo. States was seen on 5/29 and symptoms have gotten better but have not resolved. Pt states unable to get new PCP until Aug.

## 2020-11-23 ENCOUNTER — Ambulatory Visit: Payer: Medicare PPO | Admitting: Emergency Medicine

## 2020-11-23 ENCOUNTER — Other Ambulatory Visit: Payer: Self-pay

## 2020-11-23 ENCOUNTER — Encounter: Payer: Self-pay | Admitting: Emergency Medicine

## 2020-11-23 VITALS — BP 158/80 | HR 87 | Temp 98.8°F | Ht 66.0 in | Wt 203.0 lb

## 2020-11-23 DIAGNOSIS — Z13220 Encounter for screening for lipoid disorders: Secondary | ICD-10-CM | POA: Diagnosis not present

## 2020-11-23 DIAGNOSIS — Z1329 Encounter for screening for other suspected endocrine disorder: Secondary | ICD-10-CM

## 2020-11-23 DIAGNOSIS — Z13228 Encounter for screening for other metabolic disorders: Secondary | ICD-10-CM

## 2020-11-23 DIAGNOSIS — Z130 Encounter for screening for diseases of the blood and blood-forming organs and certain disorders involving the immune mechanism: Secondary | ICD-10-CM

## 2020-11-23 DIAGNOSIS — Z0000 Encounter for general adult medical examination without abnormal findings: Secondary | ICD-10-CM

## 2020-11-23 DIAGNOSIS — Z7689 Persons encountering health services in other specified circumstances: Secondary | ICD-10-CM

## 2020-11-23 DIAGNOSIS — Z23 Encounter for immunization: Secondary | ICD-10-CM

## 2020-11-23 DIAGNOSIS — Z1211 Encounter for screening for malignant neoplasm of colon: Secondary | ICD-10-CM

## 2020-11-23 LAB — LIPID PANEL
Cholesterol: 193 mg/dL (ref 0–200)
HDL: 49.7 mg/dL (ref >39.00)
LDL Cholesterol: 117 mg/dL — ABNORMAL HIGH (ref 0–99)
NonHDL: 142.8
Total CHOL/HDL Ratio: 4
Triglycerides: 129 mg/dL (ref 0.0–149.0)
VLDL: 25.8 mg/dL (ref 0.0–40.0)

## 2020-11-23 LAB — COMPREHENSIVE METABOLIC PANEL
ALT: 15 U/L (ref 0–35)
AST: 16 U/L (ref 0–37)
Albumin: 4.3 g/dL (ref 3.5–5.2)
Alkaline Phosphatase: 77 U/L (ref 39–117)
BUN: 16 mg/dL (ref 6–23)
CO2: 26 mEq/L (ref 19–32)
Calcium: 9.4 mg/dL (ref 8.4–10.5)
Chloride: 104 mEq/L (ref 96–112)
Creatinine, Ser: 0.78 mg/dL (ref 0.40–1.20)
GFR: 78.62 mL/min (ref >60.00)
Glucose, Bld: 92 mg/dL (ref 70–99)
Potassium: 4.2 mEq/L (ref 3.5–5.1)
Sodium: 139 mEq/L (ref 135–145)
Total Bilirubin: 0.3 mg/dL (ref 0.2–1.2)
Total Protein: 7 g/dL (ref 6.0–8.3)

## 2020-11-23 LAB — HEMOGLOBIN A1C: Hgb A1c MFr Bld: 5.9 % (ref 4.6–6.5)

## 2020-11-23 LAB — CBC WITH DIFFERENTIAL/PLATELET
Basophils Absolute: 0.1 10*3/uL (ref 0.0–0.1)
Basophils Relative: 0.9 % (ref 0.0–3.0)
Eosinophils Absolute: 0.2 10*3/uL (ref 0.0–0.7)
Eosinophils Relative: 2.9 % (ref 0.0–5.0)
HCT: 40.7 % (ref 36.0–46.0)
Hemoglobin: 13.3 g/dL (ref 12.0–15.0)
Lymphocytes Relative: 26.3 % (ref 12.0–46.0)
Lymphs Abs: 2.1 10*3/uL (ref 0.7–4.0)
MCHC: 32.8 g/dL (ref 30.0–36.0)
MCV: 92.9 fl (ref 78.0–100.0)
Monocytes Absolute: 0.8 10*3/uL (ref 0.1–1.0)
Monocytes Relative: 9.4 % (ref 3.0–12.0)
Neutro Abs: 4.9 10*3/uL (ref 1.4–7.7)
Neutrophils Relative %: 60.5 % (ref 43.0–77.0)
Platelets: 284 10*3/uL (ref 150.0–400.0)
RBC: 4.38 Mil/uL (ref 3.87–5.11)
RDW: 14.4 % (ref 11.5–15.5)
WBC: 8.1 10*3/uL (ref 4.0–10.5)

## 2020-11-23 NOTE — Patient Instructions (Signed)
Health Maintenance, Female Adopting a healthy lifestyle and getting preventive care are important in promoting health and wellness. Ask your health care provider about: The right schedule for you to have regular tests and exams. Things you can do on your own to prevent diseases and keep yourself healthy. What should I know about diet, weight, and exercise? Eat a healthy diet  Eat a diet that includes plenty of vegetables, fruits, low-fat dairy products, and lean protein. Do not eat a lot of foods that are high in solid fats, added sugars, or sodium.  Maintain a healthy weight Body mass index (BMI) is used to identify weight problems. It estimates body fat based on height and weight. Your health care provider can help determineyour BMI and help you achieve or maintain a healthy weight. Get regular exercise Get regular exercise. This is one of the most important things you can do for your health. Most adults should: Exercise for at least 150 minutes each week. The exercise should increase your heart rate and make you sweat (moderate-intensity exercise). Do strengthening exercises at least twice a week. This is in addition to the moderate-intensity exercise. Spend less time sitting. Even light physical activity can be beneficial. Watch cholesterol and blood lipids Have your blood tested for lipids and cholesterol at 67 years of age, then havethis test every 5 years. Have your cholesterol levels checked more often if: Your lipid or cholesterol levels are high. You are older than 67 years of age. You are at high risk for heart disease. What should I know about cancer screening? Depending on your health history and family history, you may need to have cancer screening at various ages. This may include screening for: Breast cancer. Cervical cancer. Colorectal cancer. Skin cancer. Lung cancer. What should I know about heart disease, diabetes, and high blood pressure? Blood pressure and heart  disease High blood pressure causes heart disease and increases the risk of stroke. This is more likely to develop in people who have high blood pressure readings, are of African descent, or are overweight. Have your blood pressure checked: Every 3-5 years if you are 18-39 years of age. Every year if you are 40 years old or older. Diabetes Have regular diabetes screenings. This checks your fasting blood sugar level. Have the screening done: Once every three years after age 40 if you are at a normal weight and have a low risk for diabetes. More often and at a younger age if you are overweight or have a high risk for diabetes. What should I know about preventing infection? Hepatitis B If you have a higher risk for hepatitis B, you should be screened for this virus. Talk with your health care provider to find out if you are at risk forhepatitis B infection. Hepatitis C Testing is recommended for: Everyone born from 1945 through 1965. Anyone with known risk factors for hepatitis C. Sexually transmitted infections (STIs) Get screened for STIs, including gonorrhea and chlamydia, if: You are sexually active and are younger than 67 years of age. You are older than 67 years of age and your health care provider tells you that you are at risk for this type of infection. Your sexual activity has changed since you were last screened, and you are at increased risk for chlamydia or gonorrhea. Ask your health care provider if you are at risk. Ask your health care provider about whether you are at high risk for HIV. Your health care provider may recommend a prescription medicine to help   prevent HIV infection. If you choose to take medicine to prevent HIV, you should first get tested for HIV. You should then be tested every 3 months for as long as you are taking the medicine. Pregnancy If you are about to stop having your period (premenopausal) and you may become pregnant, seek counseling before you get  pregnant. Take 400 to 800 micrograms (mcg) of folic acid every day if you become pregnant. Ask for birth control (contraception) if you want to prevent pregnancy. Osteoporosis and menopause Osteoporosis is a disease in which the bones lose minerals and strength with aging. This can result in bone fractures. If you are 65 years old or older, or if you are at risk for osteoporosis and fractures, ask your health care provider if you should: Be screened for bone loss. Take a calcium or vitamin D supplement to lower your risk of fractures. Be given hormone replacement therapy (HRT) to treat symptoms of menopause. Follow these instructions at home: Lifestyle Do not use any products that contain nicotine or tobacco, such as cigarettes, e-cigarettes, and chewing tobacco. If you need help quitting, ask your health care provider. Do not use street drugs. Do not share needles. Ask your health care provider for help if you need support or information about quitting drugs. Alcohol use Do not drink alcohol if: Your health care provider tells you not to drink. You are pregnant, may be pregnant, or are planning to become pregnant. If you drink alcohol: Limit how much you use to 0-1 drink a day. Limit intake if you are breastfeeding. Be aware of how much alcohol is in your drink. In the [place] one drink equals one 12 oz bottle of beer (355 mL), one 5 oz glass of wine (148 mL), or one 1 oz glass of hard liquor (44 mL). General instructions Schedule regular health, dental, and eye exams. Stay current with your vaccines. Tell your health care provider if: You often feel depressed. You have ever been abused or do not feel safe at home. Summary Adopting a healthy lifestyle and getting preventive care are important in promoting health and wellness. Follow your health care provider's instructions about healthy diet, exercising, and getting tested or screened for diseases. Follow your health care provider's  instructions on monitoring your cholesterol and blood pressure. This information is not intended to replace advice given to you by your health care provider. Make sure you discuss any questions you have with your healthcare provider. Document Revised: 03/26/2018 Document Reviewed: 03/26/2018 Elsevier Patient Education  2022 Elsevier Inc.  

## 2020-11-23 NOTE — Progress Notes (Signed)
Caroline Bullock 65 y.o.   Chief Complaint  Patient presents with   New Patient (Initial Visit)    Would like to discuss vertigo and right ankle, which was broke, but is healing     HISTORY OF PRESENT ILLNESS: This is a 65 y.o. female former patient of Caroline Bullock here to establish care with me and annual exam. Healthy female with a healthy lifestyle. Earlier this year sustaining right fracture ankle that required surgery.  Recovering well. Recently had bout of vertigo, has been taking meclizine intermittently.  Slowly getting better. Up-to-date with mammogram.  Needs referral for colonoscopy. No other complaints or medical concerns today.  HPI   Prior to Admission medications   Medication Sig Start Date End Date Taking? Authorizing Provider  fluticasone (FLONASE) 50 MCG/ACT nasal spray Place 2 sprays into both nostrils daily. 09/11/20  Yes Covington, Sarah M, PA-C  meclizine (ANTIVERT) 12.5 MG tablet Take 1 tablet (12.5 mg total) by mouth 3 (three) times daily as needed for dizziness. 09/11/20  Yes Covington, Sarah M, PA-C    Allergies  Allergen Reactions   Benadryl [Diphenhydramine Hcl] Hives   Fish-Derived Products Nausea And Vomiting   Penicillins Hives   Shellfish Allergy Nausea And Vomiting   Tramadol Nausea And Vomiting    There are no problems to display for this patient.   Past Medical History:  Diagnosis Date   Acid indigestion    rare - TUMS as needed   Allergy    Anxiety    no current meds.   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   Dental crowns present    also a lower dental implant   PONV (postoperative nausea and vomiting)     Past Surgical History:  Procedure Laterality Date   CESAREAN SECTION     x 3   CHOLECYSTECTOMY     MASS EXCISION  12/20/2011   Procedure: EXCISION MASS;  Surgeon: Wylene Simmer, MD;  Location: [facility];  Service: Orthopedics;  Laterality: Right;   TONSILLECTOMY     TUBAL LIGATION     WISDOM TOOTH EXTRACTION       Social History   Socioeconomic History   Marital status: Married    Spouse name: Not on file   Number of children: Not on file   Years of education: Not on file   Highest education level: Not on file  Occupational History   Not on file  Tobacco Use   Smoking status: Never   Smokeless tobacco: Never  Substance and Sexual Activity   Alcohol use: Yes    Alcohol/week: 3.0 - 5.0 standard drinks    Types: 3 - 5 Glasses of wine per week    Comment: glass wine 2 x/week   Drug use: No   Sexual activity: Yes    Birth control/protection: None  Other Topics Concern   Not on file  Social History Narrative   Married. Education: [employer]. Exercise: Yes   Social Determinants of Health   Financial Resource Strain: Not on file  Food Insecurity: Not on file  Transportation Needs: Not on file  Physical Activity: Not on file  Stress: Not on file  Social Connections: Not on file  Intimate Partner Violence: Not on file    Family History  Problem Relation Age of Onset   Cancer Mother        spot on lung   Diabetes Mother    Heart disease Father        vascular  Diabetes Sister    Diabetes Maternal Grandmother    Heart disease Maternal Grandmother      Review of Systems  Constitutional: Negative.  Negative for chills and fever.  HENT: Negative.  Negative for congestion and sore throat.   Respiratory: Negative.  Negative for cough and shortness of breath.   Cardiovascular: Negative.  Negative for chest pain and palpitations.  Gastrointestinal: Negative.  Negative for abdominal pain, blood in stool, diarrhea, melena, nausea and vomiting.  Genitourinary: Negative.  Negative for dysuria and hematuria.  Musculoskeletal: Negative.   Skin: Negative.  Negative for rash.  Neurological: Negative.  Negative for dizziness and headaches.  All other systems reviewed and are negative.  Today's Vitals   11/23/20 1403  BP: (!) 158/80  Pulse: 87  Temp: 98.8 F (37.1 C)  TempSrc: Oral   SpO2: 97%  Weight: 203 lb (92.1 kg)  Height: '5\' 6"'$  (1.676 m)   Body mass index is 32.77 kg/m.  Physical Exam Vitals reviewed.  Constitutional:      Appearance: Normal appearance.  HENT:     Head: Normocephalic.     Right Ear: Tympanic membrane, ear canal and external ear normal.     Left Ear: Tympanic membrane, ear canal and external ear normal.     Mouth/Throat:     Mouth: Mucous membranes are moist.     Pharynx: Oropharynx is clear.  Eyes:     Extraocular Movements: Extraocular movements intact.     Conjunctiva/sclera: Conjunctivae normal.     Pupils: Pupils are equal, round, and reactive to light.  Cardiovascular:     Rate and Rhythm: Normal rate and regular rhythm.     Pulses: Normal pulses.     Heart sounds: Normal heart sounds.  Pulmonary:     Effort: Pulmonary effort is normal.     Breath sounds: Normal breath sounds.  Abdominal:     General: Bowel sounds are normal. There is no distension.     Palpations: Abdomen is soft.     Tenderness: There is no abdominal tenderness.  Musculoskeletal:        General: Normal range of motion.     Cervical back: Normal range of motion and neck supple. No tenderness.  Lymphadenopathy:     Cervical: No cervical adenopathy.  Skin:    General: Skin is warm and dry.     Capillary Refill: Capillary refill takes less than 2 seconds.  Neurological:     General: No focal deficit present.     Mental Status: She is alert and oriented to person, place, and time.  Psychiatric:        Mood and Affect: Mood normal.        Behavior: Behavior normal.     ASSESSMENT & PLAN: Caroline Bullock was seen today for new patient (initial visit).  Diagnoses and all orders for this visit:  Routine general medical examination at a health care facility  Need for Streptococcus pneumoniae vaccination -     Pneumococcal conjugate vaccine 20-valent (Prevnar 20)  Encounter to establish care  Colon cancer screening  Screening for deficiency anemia -      CBC with Differential/Platelet  Screening for lipoid disorders -     Lipid panel  Screening for endocrine, metabolic and immunity disorder -     Comprehensive metabolic panel -     Hemoglobin A1c  Modifiable risk factors discussed with patient. Anticipatory guidance according to age provided. The following topics were also discussed: Social Determinants of Health Smoking Diet  and nutrition Benefits of exercise Cancer screening and need for colon cancer screening with colonoscopy.  Most recent mammogram report reviewed with patient. Vaccinations recommendations Cardiovascular risk assessment Mental health including depression and anxiety Fall and accident prevention  Patient Instructions  Health Maintenance, Female Adopting a healthy lifestyle and getting preventive care are important in promoting health and wellness. Ask your health care provider about: The right schedule for you to have regular tests and exams. Things you can do on your own to prevent diseases and keep yourself healthy. What should I know about diet, weight, and exercise? Eat a healthy diet  Eat a diet that includes plenty of vegetables, fruits, low-fat dairy products, and lean protein. Do not eat a lot of foods that are high in solid fats, added sugars, or sodium.  Maintain a healthy weight Body mass index (BMI) is used to identify weight problems. It estimates body fat based on height and weight. Your health care provider can help determineyour BMI and help you achieve or maintain a healthy weight. Get regular exercise Get regular exercise. This is one of the most important things you can do for your health. Most adults should: Exercise for at least 150 minutes each week. The exercise should increase your heart rate and make you sweat (moderate-intensity exercise). Do strengthening exercises at least twice a week. This is in addition to the moderate-intensity exercise. Spend less time sitting. Even light  physical activity can be beneficial. Watch cholesterol and blood lipids Have your blood tested for lipids and cholesterol at 65 years of age, then havethis test every 5 years. Have your cholesterol levels checked more often if: Your lipid or cholesterol levels are high. You are older than 67 years of age. You are at high risk for heart disease. What should I know about cancer screening? Depending on your health history and family history, you may need to have cancer screening at various ages. This may include screening for: Breast cancer. Cervical cancer. Colorectal cancer. Skin cancer. Lung cancer. What should I know about heart disease, diabetes, and high blood pressure? Blood pressure and heart disease High blood pressure causes heart disease and increases the risk of stroke. This is more likely to develop in people who have high blood pressure readings, are of African descent, or are overweight. Have your blood pressure checked: Every 3-5 years if you are 15-6 years of age. Every year if you are 18 years old or older. Diabetes Have regular diabetes screenings. This checks your fasting blood sugar level. Have the screening done: Once every three years after age 92 if you are at a normal weight and have a low risk for diabetes. More often and at a younger age if you are overweight or have a high risk for diabetes. What should I know about preventing infection? Hepatitis B If you have a higher risk for hepatitis B, you should be screened for this virus. Talk with your health care provider to find out if you are at risk forhepatitis B infection. Hepatitis C Testing is recommended for: Everyone born from [birth year] through 1965. Anyone with known risk factors for hepatitis C. Sexually transmitted infections (STIs) Get screened for STIs, including gonorrhea and chlamydia, if: You are sexually active and are younger than 67 years of age. You are older than 67 years of age and your health  care provider tells you that you are at risk for this type of infection. Your sexual activity has changed since you were last screened, and you  are at increased risk for chlamydia or gonorrhea. Ask your health care provider if you are at risk. Ask your health care provider about whether you are at high risk for HIV. Your health care provider may recommend a prescription medicine to help prevent HIV infection. If you choose to take medicine to prevent HIV, you should first get tested for HIV. You should then be tested every 3 months for as long as you are taking the medicine. Pregnancy If you are about to stop having your period (premenopausal) and you may become pregnant, seek counseling before you get pregnant. Take 400 to 800 micrograms (mcg) of folic acid every day if you become pregnant. Ask for birth control (contraception) if you want to prevent pregnancy. Osteoporosis and menopause Osteoporosis is a disease in which the bones lose minerals and strength with aging. This can result in bone fractures. If you are 23 years old or older, or if you are at risk for osteoporosis and fractures, ask your health care provider if you should: Be screened for bone loss. Take a calcium or vitamin D supplement to lower your risk of fractures. Be given hormone replacement therapy (HRT) to treat symptoms of menopause. Follow these instructions at home: Lifestyle Do not use any products that contain nicotine or tobacco, such as cigarettes, e-cigarettes, and chewing tobacco. If you need help quitting, ask your health care provider. Do not use street drugs. Do not share needles. Ask your health care provider for help if you need support or information about quitting drugs. Alcohol use Do not drink alcohol if: Your health care provider tells you not to drink. You are pregnant, may be pregnant, or are planning to become pregnant. If you drink alcohol: Limit how much you use to 0-1 drink a day. Limit intake if  you are breastfeeding. Be aware of how much alcohol is in your drink. In the [place] one drink equals one 12 oz bottle of beer (355 mL), one 5 oz glass of wine (148 mL), or one 1 oz glass of hard liquor (44 mL). General instructions Schedule regular health, dental, and eye exams. Stay current with your vaccines. Tell your health care provider if: You often feel depressed. You have ever been abused or do not feel safe at home. Summary Adopting a healthy lifestyle and getting preventive care are important in promoting health and wellness. Follow your health care provider's instructions about healthy diet, exercising, and getting tested or screened for diseases. Follow your health care provider's instructions on monitoring your cholesterol and blood pressure. This information is not intended to replace advice given to you by your health care provider. Make sure you discuss any questions you have with your healthcare provider. Document Revised: 03/26/2018 Document Reviewed: 03/26/2018 Elsevier Patient Education  2022 [place], MD [facility]

## 2021-05-01 DIAGNOSIS — Z1231 Encounter for screening mammogram for malignant neoplasm of breast: Secondary | ICD-10-CM | POA: Diagnosis not present

## 2021-05-01 LAB — HM MAMMOGRAPHY

## 2021-05-15 ENCOUNTER — Encounter: Payer: Self-pay | Admitting: Emergency Medicine

## 2021-05-15 DIAGNOSIS — Z780 Asymptomatic menopausal state: Secondary | ICD-10-CM | POA: Diagnosis not present

## 2021-05-15 DIAGNOSIS — M85852 Other specified disorders of bone density and structure, left thigh: Secondary | ICD-10-CM | POA: Diagnosis not present

## 2021-05-15 DIAGNOSIS — M85851 Other specified disorders of bone density and structure, right thigh: Secondary | ICD-10-CM | POA: Diagnosis not present

## 2021-05-26 ENCOUNTER — Encounter: Payer: Self-pay | Admitting: Emergency Medicine

## 2021-05-29 ENCOUNTER — Encounter: Payer: Self-pay | Admitting: Emergency Medicine

## 2022-01-23 ENCOUNTER — Telehealth: Payer: Self-pay | Admitting: *Deleted

## 2022-01-23 NOTE — Patient Outreach (Signed)
  Care Coordination   Initial Visit Note   01/23/2022 Name: Caroline Bullock MRN: [id number] DOB: 1956/04/11  Caroline Bullock is a 66 y.o. year old female who sees Sagardia, Ines Bloomer, MD for primary care. I spoke with  Sherilyn Dacosta by phone today.  What matters to the patients health and wellness today?  No needs    Goals Addressed             This Visit's Progress    COMPLETED: No needs       Care Coordination Interventions: Advised patient to contact their provider's office to schedule and AWV Reviewed scheduled/upcoming provider appointments including any upcoming appointments noted via EPIC Assessed social determinant of health barriers         SDOH assessments and interventions completed:  Yes  SDOH Interventions Today    Flowsheet Row Most Recent Value  SDOH Interventions   Food Insecurity Interventions Intervention Not Indicated  Housing Interventions Intervention Not Indicated  Transportation Interventions Intervention Not Indicated        Care Coordination Interventions Activated:  Yes  Care Coordination Interventions:  Yes, provided   Follow up plan: No further intervention required.   Encounter Outcome:  Pt. Visit Completed   Raina Mina, RN Care Management Coordinator [place] Office [phone]

## 2022-01-23 NOTE — Patient Instructions (Signed)
Visit Information  Thank you for taking time to visit with me today. Please don't hesitate to contact me if I can be of assistance to you.   Following are the goals we discussed today:   Goals Addressed             This Visit's Progress    COMPLETED: No needs       Care Coordination Interventions: Advised patient to contact their provider's office to schedule and AWV Reviewed scheduled/upcoming provider appointments including any upcoming appointments noted via EPIC Assessed social determinant of health barriers         Please call the care guide team at [phone] if you need to cancel or reschedule your appointment.   If you are experiencing a Mental Health or [place] or need someone to talk to, please call the Suicide and Crisis Lifeline: 988  Patient verbalizes understanding of instructions and care plan provided today and agrees to view in [place]. Active MyChart status and patient understanding of how to access instructions and care plan via MyChart confirmed with patient.     No further follow up required: No needs    Raina Mina, RN Care Management Coordinator [place] Office [phone]

## 2022-02-06 DIAGNOSIS — H52223 Regular astigmatism, bilateral: Secondary | ICD-10-CM | POA: Diagnosis not present

## 2022-05-07 DIAGNOSIS — Z1231 Encounter for screening mammogram for malignant neoplasm of breast: Secondary | ICD-10-CM | POA: Diagnosis not present

## 2022-05-07 LAB — HM MAMMOGRAPHY

## 2022-07-09 DIAGNOSIS — H43813 Vitreous degeneration, bilateral: Secondary | ICD-10-CM | POA: Diagnosis not present

## 2023-02-12 DIAGNOSIS — H52223 Regular astigmatism, bilateral: Secondary | ICD-10-CM | POA: Diagnosis not present

## 2023-05-13 DIAGNOSIS — Z1231 Encounter for screening mammogram for malignant neoplasm of breast: Secondary | ICD-10-CM | POA: Diagnosis not present

## 2023-05-13 LAB — HM MAMMOGRAPHY

## 2023-05-15 ENCOUNTER — Encounter: Payer: Self-pay | Admitting: Emergency Medicine

## 2023-06-18 ENCOUNTER — Encounter (HOSPITAL_COMMUNITY): Payer: Self-pay

## 2023-06-18 ENCOUNTER — Emergency Department (HOSPITAL_COMMUNITY)
Admission: EM | Admit: 2023-06-18 | Discharge: 2023-06-18 | Disposition: A | Discharge status: Home / Self Care | Attending: Emergency Medicine | Admitting: Emergency Medicine

## 2023-06-18 ENCOUNTER — Emergency Department (HOSPITAL_COMMUNITY)

## 2023-06-18 ENCOUNTER — Other Ambulatory Visit: Payer: Self-pay

## 2023-06-18 DIAGNOSIS — S0990XA Unspecified injury of head, initial encounter: Secondary | ICD-10-CM

## 2023-06-18 DIAGNOSIS — S0101XA Laceration without foreign body of scalp, initial encounter: Secondary | ICD-10-CM | POA: Insufficient documentation

## 2023-06-18 DIAGNOSIS — S199XXA Unspecified injury of neck, initial encounter: Secondary | ICD-10-CM | POA: Diagnosis not present

## 2023-06-18 DIAGNOSIS — Z23 Encounter for immunization: Secondary | ICD-10-CM | POA: Diagnosis not present

## 2023-06-18 DIAGNOSIS — Y9389 Activity, other specified: Secondary | ICD-10-CM | POA: Diagnosis not present

## 2023-06-18 DIAGNOSIS — I6529 Occlusion and stenosis of unspecified carotid artery: Secondary | ICD-10-CM | POA: Diagnosis not present

## 2023-06-18 DIAGNOSIS — I672 Cerebral atherosclerosis: Secondary | ICD-10-CM | POA: Diagnosis not present

## 2023-06-18 DIAGNOSIS — W01190A Fall on same level from slipping, tripping and stumbling with subsequent striking against furniture, initial encounter: Secondary | ICD-10-CM | POA: Diagnosis not present

## 2023-06-18 DIAGNOSIS — W01198A Fall on same level from slipping, tripping and stumbling with subsequent striking against other object, initial encounter: Secondary | ICD-10-CM | POA: Diagnosis not present

## 2023-06-18 MED ORDER — TETANUS-DIPHTH-ACELL PERTUSSIS 5-2.5-18.5 LF-MCG/0.5 IM SUSY
0.5000 mL | PREFILLED_SYRINGE | Freq: Once | INTRAMUSCULAR | Status: AC
  Administered 2023-06-18: 0.5 mL via INTRAMUSCULAR
  Filled 2023-06-18: qty 0.5

## 2023-06-18 NOTE — ED Triage Notes (Signed)
 Pt fell, hitting the back of her head on the coffee table. Pt has a small laceration, bleeding controlled.

## 2023-06-18 NOTE — Discharge Instructions (Addendum)
 Ct scan is reassuring, no evidence of brain bleed. Allow for the wound to heal on its own.  Avoid shampooing.  Avoid rubbing the affected area for at least the next 3 to 5 days.

## 2023-06-18 NOTE — ED Provider Notes (Signed)
 [facility] AT [facility] Provider Note   CSN: [id number] Arrival date & time: 06/18/23  1800     History  Chief Complaint  Patient presents with   Head Laceration    Caroline Bullock is a 68 y.o. female.  HPI    68 year old female comes in with chief complaint of head laceration.  Patient was sent to the ER from urgent care.  Patient states that she was playing piano, the bench broke, she fell backwards and struck the back of her head onto edge of a table.  She had profound amount of bleeding, which prompted her to go to the urgent care.  Urgent care sent her to the ER given her age over 60.  Patient is having mild to moderate posterior headache.  She has applied ice to the area.  She denies any loss of consciousness.  No new neurologic deficits.  She is accompanied by daughter.  Patient is not on any blood thinners.  No injury elsewhere.  Home Medications Prior to Admission medications   Medication Sig Start Date End Date Taking? Authorizing Provider  fluticasone (FLONASE) 50 MCG/ACT nasal spray Place 2 sprays into both nostrils daily. 09/11/20   Rushie Chestnut, PA-C  meclizine (ANTIVERT) 12.5 MG tablet Take 1 tablet (12.5 mg total) by mouth 3 (three) times daily as needed for dizziness. 09/11/20   Rushie Chestnut, PA-C      Allergies    Benadryl [diphenhydramine hcl], Fish-derived products, Penicillins, Shellfish allergy, and Tramadol    Review of Systems   Review of Systems  All other systems reviewed and are negative.   Physical Exam Updated Vital Signs BP 134/76 (BP Location: Left Arm)   Pulse 82   Temp 98.2 F (36.8 C) (Oral)   Resp 18   Ht 5\' 6"  (1.676 m)   Wt 92.1 kg   SpO2 98%   BMI 32.77 kg/m  Physical Exam Vitals and nursing note reviewed.  Constitutional:      Appearance: She is well-developed.  HENT:     Head: Atraumatic.  Cardiovascular:     Rate and Rhythm: Normal rate.  Pulmonary:     Effort: Pulmonary effort  is normal.  Musculoskeletal:     Cervical back: Normal range of motion and neck supple.     Comments: 0.5 cm laceration to the scalp, nor deep, no active bleeding.   Skin:    General: Skin is warm and dry.  Neurological:     Mental Status: She is alert and oriented to person, place, and time.     ED Results / Procedures / Treatments   Labs (all labs ordered are listed, but only abnormal results are displayed) Labs Reviewed - No data to display  EKG None  Radiology No results found.  Procedures Procedures    Medications Ordered in ED Medications - No data to display  ED Course/ Medical Decision Making/ A&P                                 Medical Decision Making  68 year old patient comes in after sustaining what appears to be a mechanical fall. Pertinent past medical includes -no anticoagulation use  Based on my history and exam, differential diagnosis includes: - Traumatic brain injury including intracranial hemorrhage -Scalp laceration -Concussion/contusion  CT scan of the brain and C-spine were ordered in triage.  I will defer interpreted patient's  CT scan of the brain, it is negative for any brain bleed.  Patient is a very superficial and small laceration to the scalp.  Discussed with her that I do not think she will benefit with repair -and patient is comfortable with that plan.  Wound care precautions discussed.  Patient's tetanus was updated.   Final Clinical Impression(s) / ED Diagnoses Final diagnoses:  Laceration of scalp, initial encounter  Traumatic injury of head, initial encounter    Rx / DC Orders ED Discharge Orders     None         Derwood Kaplan, MD 06/18/23 2145

## 2023-08-22 ENCOUNTER — Encounter: Payer: Self-pay | Admitting: Emergency Medicine

## 2023-08-22 ENCOUNTER — Ambulatory Visit (INDEPENDENT_AMBULATORY_CARE_PROVIDER_SITE_OTHER): Admitting: Emergency Medicine

## 2023-08-22 VITALS — BP 134/72 | HR 90 | Temp 98.5°F | Ht 66.0 in | Wt 209.0 lb

## 2023-08-22 DIAGNOSIS — Z1211 Encounter for screening for malignant neoplasm of colon: Secondary | ICD-10-CM

## 2023-08-22 DIAGNOSIS — Z130 Encounter for screening for diseases of the blood and blood-forming organs and certain disorders involving the immune mechanism: Secondary | ICD-10-CM

## 2023-08-22 DIAGNOSIS — Z1329 Encounter for screening for other suspected endocrine disorder: Secondary | ICD-10-CM

## 2023-08-22 DIAGNOSIS — Z0000 Encounter for general adult medical examination without abnormal findings: Secondary | ICD-10-CM | POA: Diagnosis not present

## 2023-08-22 DIAGNOSIS — Z13228 Encounter for screening for other metabolic disorders: Secondary | ICD-10-CM | POA: Diagnosis not present

## 2023-08-22 DIAGNOSIS — Z13220 Encounter for screening for lipoid disorders: Secondary | ICD-10-CM | POA: Diagnosis not present

## 2023-08-22 LAB — CBC WITH DIFFERENTIAL/PLATELET
Basophils Absolute: 0.1 10*3/uL (ref 0.0–0.1)
Basophils Relative: 0.7 % (ref 0.0–3.0)
Eosinophils Absolute: 0.3 10*3/uL (ref 0.0–0.7)
Eosinophils Relative: 3.2 % (ref 0.0–5.0)
HCT: 42.9 % (ref 36.0–46.0)
Hemoglobin: 14.3 g/dL (ref 12.0–15.0)
Lymphocytes Relative: 26.4 % (ref 12.0–46.0)
Lymphs Abs: 2.3 10*3/uL (ref 0.7–4.0)
MCHC: 33.2 g/dL (ref 30.0–36.0)
MCV: 91.8 fl (ref 78.0–100.0)
Monocytes Absolute: 0.7 10*3/uL (ref 0.1–1.0)
Monocytes Relative: 8.8 % (ref 3.0–12.0)
Neutro Abs: 5.2 10*3/uL (ref 1.4–7.7)
Neutrophils Relative %: 60.9 % (ref 43.0–77.0)
Platelets: 312 10*3/uL (ref 150.0–400.0)
RBC: 4.67 Mil/uL (ref 3.87–5.11)
RDW: 14.4 % (ref 11.5–15.5)
WBC: 8.5 10*3/uL (ref 4.0–10.5)

## 2023-08-22 LAB — LIPID PANEL
Cholesterol: 159 mg/dL (ref 0–200)
HDL: 47.3 mg/dL (ref >39.00)
LDL Cholesterol: 85 mg/dL (ref 0–99)
NonHDL: 111.73
Total CHOL/HDL Ratio: 3
Triglycerides: 136 mg/dL (ref 0.0–149.0)
VLDL: 27.2 mg/dL (ref 0.0–40.0)

## 2023-08-22 LAB — COMPREHENSIVE METABOLIC PANEL WITH GFR
ALT: 23 U/L (ref 0–35)
AST: 22 U/L (ref 0–37)
Albumin: 4.5 g/dL (ref 3.5–5.2)
Alkaline Phosphatase: 65 U/L (ref 39–117)
BUN: 12 mg/dL (ref 6–23)
CO2: 28 meq/L (ref 19–32)
Calcium: 9.8 mg/dL (ref 8.4–10.5)
Chloride: 101 meq/L (ref 96–112)
Creatinine, Ser: 0.8 mg/dL (ref 0.40–1.20)
GFR: 74.81 mL/min (ref >60.00)
Glucose, Bld: 123 mg/dL — ABNORMAL HIGH (ref 70–99)
Potassium: 4.3 meq/L (ref 3.5–5.1)
Sodium: 137 meq/L (ref 135–145)
Total Bilirubin: 0.4 mg/dL (ref 0.2–1.2)
Total Protein: 7.4 g/dL (ref 6.0–8.3)

## 2023-08-22 LAB — HEMOGLOBIN A1C: Hgb A1c MFr Bld: 6.2 % (ref 4.6–6.5)

## 2023-08-22 LAB — TSH: TSH: 1 u[IU]/mL (ref 0.35–5.50)
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
## 2023-08-22 NOTE — Patient Instructions (Signed)

## 2023-10-24 ENCOUNTER — Encounter: Payer: Self-pay | Admitting: Emergency Medicine

## 2023-10-31 ENCOUNTER — Encounter: Payer: Self-pay | Admitting: Gastroenterology

## 2023-12-25 ENCOUNTER — Ambulatory Visit (AMBULATORY_SURGERY_CENTER)

## 2023-12-25 ENCOUNTER — Encounter: Payer: Self-pay | Admitting: Gastroenterology

## 2023-12-25 VITALS — Ht 66.0 in | Wt 199.0 lb

## 2023-12-25 DIAGNOSIS — Z1211 Encounter for screening for malignant neoplasm of colon: Secondary | ICD-10-CM

## 2023-12-25 DIAGNOSIS — Z860100 Personal history of colon polyps, unspecified: Secondary | ICD-10-CM

## 2023-12-25 MED ORDER — NA SULFATE-K SULFATE-MG SULF 17.5-3.13-1.6 GM/177ML PO SOLN: 1.0000 | Freq: Once | ORAL | 0 refills | Status: AC

## 2023-12-25 NOTE — Progress Notes (Signed)

## 2024-01-08 ENCOUNTER — Ambulatory Visit: Admitting: Gastroenterology

## 2024-01-08 ENCOUNTER — Encounter: Payer: Self-pay | Admitting: Gastroenterology

## 2024-01-08 VITALS — BP 115/47 | HR 71 | Temp 97.3°F | Resp 15 | Ht 66.0 in | Wt 199.0 lb

## 2024-01-08 DIAGNOSIS — D123 Benign neoplasm of transverse colon: Secondary | ICD-10-CM | POA: Diagnosis not present

## 2024-01-08 DIAGNOSIS — F419 Anxiety disorder, unspecified: Secondary | ICD-10-CM | POA: Diagnosis not present

## 2024-01-08 DIAGNOSIS — D121 Benign neoplasm of appendix: Secondary | ICD-10-CM | POA: Diagnosis not present

## 2024-01-08 DIAGNOSIS — K635 Polyp of colon: Secondary | ICD-10-CM | POA: Diagnosis not present

## 2024-01-08 DIAGNOSIS — D125 Benign neoplasm of sigmoid colon: Secondary | ICD-10-CM | POA: Diagnosis not present

## 2024-01-08 DIAGNOSIS — K644 Residual hemorrhoidal skin tags: Secondary | ICD-10-CM

## 2024-01-08 DIAGNOSIS — K648 Other hemorrhoids: Secondary | ICD-10-CM | POA: Diagnosis not present

## 2024-01-08 DIAGNOSIS — K388 Other specified diseases of appendix: Secondary | ICD-10-CM | POA: Diagnosis not present

## 2024-01-08 DIAGNOSIS — D124 Benign neoplasm of descending colon: Secondary | ICD-10-CM | POA: Diagnosis not present

## 2024-01-08 DIAGNOSIS — Z1211 Encounter for screening for malignant neoplasm of colon: Secondary | ICD-10-CM

## 2024-01-08 MED ORDER — SODIUM CHLORIDE 0.9 % IV SOLN: 500.0000 mL | Freq: Once | INTRAVENOUS | Status: DC

## 2024-01-08 NOTE — Patient Instructions (Signed)
 Please read handouts provided. Continue present medications. Resume previous diet. Await pathology results.   YOU HAD AN ENDOSCOPIC PROCEDURE TODAY AT THE [facility]:   Refer to the procedure report that was given to you for any specific questions about what was found during the examination.  If the procedure report does not answer your questions, please call your gastroenterologist to clarify.  If you requested that your care partner not be given the details of your procedure findings, then the procedure report has been included in a sealed envelope for you to review at your convenience later.  YOU SHOULD EXPECT: Some feelings of bloating in the abdomen. Passage of more gas than usual.  Walking can help get rid of the air that was put into your GI tract during the procedure and reduce the bloating. If you had a lower endoscopy (such as a colonoscopy or flexible sigmoidoscopy) you may notice spotting of blood in your stool or on the toilet paper. If you underwent a bowel prep for your procedure, you may not have a normal bowel movement for a few days.  Please Note:  You might notice some irritation and congestion in your nose or some drainage.  This is from the oxygen used during your procedure.  There is no need for concern and it should clear up in a day or so.  SYMPTOMS TO REPORT IMMEDIATELY:  Following lower endoscopy (colonoscopy or flexible sigmoidoscopy):  Excessive amounts of blood in the stool  Significant tenderness or worsening of abdominal pains  Swelling of the abdomen that is new, acute  Fever of 100F or higher.  For urgent or emergent issues, a gastroenterologist can be reached at any hour by calling [phone]. Do not use MyChart messaging for urgent concerns.    DIET:  We do recommend a small meal at first, but then you may proceed to your regular diet.  Drink plenty of fluids but you should avoid alcoholic beverages for 24 hours.  ACTIVITY:  You should  plan to take it easy for the rest of today and you should NOT DRIVE or use heavy machinery until tomorrow (because of the sedation medicines used during the test).    FOLLOW UP: Our staff will call the number listed on your records the next business day following your procedure.  We will call around 7:15- 8:00 am to check on you and address any questions or concerns that you may have regarding the information given to you following your procedure. If we do not reach you, we will leave a message.     If any biopsies were taken you will be contacted by phone or by letter within the next 1-3 weeks.  Please call us at [phone] if you have not heard about the biopsies in 3 weeks.    SIGNATURES/CONFIDENTIALITY: You and/or your care partner have signed paperwork which will be entered into your electronic medical record.  These signatures attest to the fact that that the information above on your After Visit Summary has been reviewed and is understood.  Full responsibility of the confidentiality of this discharge information lies with you and/or your care-partner.

## 2024-01-08 NOTE — Progress Notes (Signed)
 Called to room to assist during endoscopic procedure.  Patient ID and intended procedure confirmed with present staff. Received instructions for my participation in the procedure from the performing physician.

## 2024-01-08 NOTE — Op Note (Signed)
 [facility] Patient Name: Caroline Bullock Procedure Date: 01/08/2024 10:56 AM MRN: [id number] Endoscopist: Elspeth P. Leigh , MD, [id number] Age: 68 Referring MD:  Date of Birth: [birth year] Gender: Female Account #: [id number] Procedure:                Colonoscopy Indications:              Screening for colorectal malignant neoplasm - last                            exam 2014, patient thinks normal Medicines:                Monitored Anesthesia Care Procedure:                Pre-Anesthesia Assessment:                           - Prior to the procedure, a History and Physical                            was performed, and patient medications and                            allergies were reviewed. The patient's tolerance of                            previous anesthesia was also reviewed. The risks                            and benefits of the procedure and the sedation                            options and risks were discussed with the patient.                            All questions were answered, and informed consent                            was obtained. Prior Anticoagulants: The patient has                            taken no anticoagulant or antiplatelet agents. ASA                            Grade Assessment: II - A patient with mild systemic                            disease. After reviewing the risks and benefits,                            the patient was deemed in satisfactory condition to                            undergo the procedure.  After obtaining informed consent, the colonoscope                            was passed under direct vision. Throughout the                            procedure, the patient's blood pressure, pulse, and                            oxygen saturations were monitored continuously. The                            Olympus Scope SN: [id number] was introduced through                            the anus and advanced  to the the cecum, identified                            by appendiceal orifice and ileocecal valve. The                            colonoscopy was performed without difficulty. The                            patient tolerated the procedure well. The quality                            of the bowel preparation was good. The ileocecal                            valve, appendiceal orifice, and rectum were                            photographed. Scope In: 11:02:36 AM Scope Out: 11:26:27 AM Scope Withdrawal Time: 0 hours 16 minutes 36 seconds  Total Procedure Duration: 0 hours 23 minutes 51 seconds  Findings:                 Skin tags were found on perianal exam.                           A 4-5 mm head of a polyp was found in the                            appendiceal orifice, protruding out of it. The                            polyp was sessile. It was pleated back with the                            snare and appeared to be invading the AO. Polyp                            resection was incomplete (  protruding area removed                            however remnant polyp tissue deeper in the AO                            visualized). The resected tissue was retrieved.                           Two sessile polyps were found in the transverse                            colon. The polyps were 4 to 5 mm in size. These                            polyps were removed with a cold snare. Resection                            and retrieval were complete.                           A 3 mm polyp was found in the descending colon. The                            polyp was sessile. The polyp was removed with a                            cold snare. Resection and retrieval were complete.                           Two sessile polyps were found in the sigmoid colon.                            The polyps were 3 to 5 mm in size. These polyps                            were removed with a cold snare. Resection and                             retrieval were complete.                           Internal hemorrhoids were found during retroflexion.                           The exam was otherwise without abnormality. Complications:            No immediate complications. Estimated blood loss:                            Minimal. Estimated Blood Loss:     Estimated blood loss was minimal. Impression:               - Perianal skin tags found on perianal exam.                           -  One 5 mm polyp at the appendiceal orifice,                            removed with a cold snare. Incomplete resection, as                            invades the AO. Resected tissue retrieved.                           - Two 4 to 5 mm polyps in the transverse colon,                            removed with a cold snare. Resected and retrieved.                           - One 3 mm polyp in the descending colon, removed                            with a cold snare. Resected and retrieved.                           - Two 3 to 5 mm polyps in the sigmoid colon,                            removed with a cold snare. Resected and retrieved.                           - Internal hemorrhoids.                           - The examination was otherwise normal.                           Will await pathology results - unfortunately I                            think the patient will warrant an appendectomy                            given these findings, invasion of polyp deeper into                            the AO. Recommendation:           - Patient has a contact number available for                            emergencies. The signs and symptoms of potential                            delayed complications were discussed with the                            patient. Return to normal activities  tomorrow.                            Written discharge instructions were provided to the                            patient.                            - Resume previous diet.                           - Continue present medications.                           - Await pathology results. Elspeth P. Frances Joynt, MD 01/08/2024 11:33:29 AM This report has been signed electronically.

## 2024-01-08 NOTE — Progress Notes (Signed)
 Sedate, gd SR, tolerated procedure well, VSS, report to RN

## 2024-01-08 NOTE — Progress Notes (Signed)
 Pt's states no medical or surgical changes since previsit or office visit.

## 2024-01-08 NOTE — Progress Notes (Signed)
 [facility] History and Physical   Primary Care Physician:  Purcell Emil Schanz, MD   Reason for Procedure:   Screening for colon cancer  Plan:    colonoscopy     HPI: Caroline Bullock is a 68 y.o. female  here for colonoscopy screening  - last exam in 11/2012 per patient, she thinks normal, done at another facility.  Patient denies any bowel symptoms at this time. No family history of colon cancer known. Otherwise feels well without any cardiopulmonary symptoms.   I have discussed risks / benefits of anesthesia and endoscopic procedure with AARIAN CLEAVER and they wish to proceed with the exams as outlined today.    Past Medical History:  Diagnosis Date   Acid indigestion    rare - TUMS as needed   Allergy    Anxiety    no current meds.   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   Current Facility-Administered Medications  Medication Dose Route Frequency Provider Last Rate Last Admin   0.9 %  sodium chloride  infusion  500 mL Intravenous Once Jarae Nemmers, Elspeth SQUIBB, MD        Allergies as of 01/08/2024 - Review Complete 01/08/2024  Allergen Reaction Noted   Benadryl [diphenhydramine hcl] Hives 10/22/2011   Fish-derived products Nausea And Vomiting 12/14/2011   Penicillins Hives 10/22/2011   Shellfish allergy Nausea And Vomiting 10/22/2011   Tramadol  Nausea And Vomiting and Nausea Only 02/13/2016    Family History  Problem Relation Age of Onset    Cancer Mother        spot on lung   Diabetes Mother    Heart disease Father        vascular   Diabetes Sister    Diabetes Maternal Grandmother    Heart disease Maternal Grandmother    Colon cancer Neg Hx    Rectal cancer Neg Hx    Stomach cancer Neg Hx     Social History   Socioeconomic History   Marital status: Married    Spouse name: Not on file   Number of children: Not on file   Years of education: Not on file   Highest education level: Bachelor's degree (e.g., BA, AB, BS)  Occupational History   Not on file  Tobacco Use   Smoking status: Never   Smokeless tobacco: Never  Substance and Sexual Activity   Alcohol use: Yes    Alcohol/week: 3.0 - 5.0 standard drinks of alcohol    Types: 3 - 5 Glasses of wine per week    Comment: glass wine 2 x/week   Drug use: No   Sexual activity: Yes    Birth control/protection: None  Other Topics Concern   Not on file  Social History Narrative   Married. Education: [employer]. Exercise: Yes   Social Drivers of Dispensing optician  Resource Strain: Low Risk  (08/21/2023)   Overall Financial Resource Strain (CARDIA)    Difficulty of Paying Living Expenses: Not hard at all  Food Insecurity: No Food Insecurity (08/21/2023)   Hunger Vital Sign    Worried About Running Out of Food in the Last Year: Never true    Ran Out of Food in the Last Year: Never true  Transportation Needs: No Transportation Needs (08/21/2023)   PRAPARE - Administrator, Civil Service (Medical): No    Lack of Transportation (Non-Medical): No  Physical Activity: Sufficiently Active (08/21/2023)   Exercise Vital Sign    Days of Exercise per Week: 7 days    Minutes of Exercise per Session: 30 min  Stress: No Stress Concern Present (08/21/2023)   [employer] of Occupational Health - Occupational Stress Questionnaire    Feeling of Stress : Only a little  Social Connections: Socially Integrated (08/21/2023)   Social Connection and Isolation Panel     Frequency of Communication with Friends and Family: More than three times a week    Frequency of Social Gatherings with Friends and Family: Once a week    Attends Religious Services: More than 4 times per year    Active Member of [employer] or Organizations: Yes    Attends Engineer, structural: More than 4 times per year    Marital Status: Married  Catering manager Violence: Not on file    Review of Systems: All other review of systems negative except as mentioned in the HPI.  Physical Exam: Vital signs BP (!) 128/56   Pulse 86   Temp (!) 97.3 F (36.3 C)   Ht 5' 6 (1.676 m)   Wt 199 lb (90.3 kg)   SpO2 99%   BMI 32.12 kg/m   General:   Alert,  Well-developed, pleasant and cooperative in NAD Lungs:  Clear throughout to auscultation.   Heart:  Regular rate and rhythm Abdomen:  Soft, nontender and nondistended.   Neuro/Psych:  Alert and cooperative. Normal mood and affect. A and O x 3  Marcey Naval, MD [facility]

## 2024-01-09 ENCOUNTER — Telehealth: Payer: Self-pay

## 2024-01-09 NOTE — Telephone Encounter (Signed)
  Follow up Call-     01/08/2024    9:51 AM  Call back number  Post procedure Call Back phone  # [phone]  Permission to leave phone message Yes     Patient questions:  Do you have a fever, pain , or abdominal swelling? No. Pain Score  0 *  Have you tolerated food without any problems? Yes.    Have you been able to return to your normal activities? Yes.    Do you have any questions about your discharge instructions: Diet   No. Medications  No. Follow up visit  No.  Do you have questions or concerns about your Care? No.  Actions: * If pain score is 4 or above: No action needed, pain <4.

## 2024-01-13 LAB — SURGICAL PATHOLOGY

## 2024-01-14 ENCOUNTER — Ambulatory Visit: Payer: Self-pay | Admitting: Gastroenterology

## 2024-01-29 ENCOUNTER — Ambulatory Visit

## 2024-01-29 VITALS — BP 128/68 | HR 82 | Ht 65.0 in | Wt 205.0 lb

## 2024-01-29 DIAGNOSIS — Z0000 Encounter for general adult medical examination without abnormal findings: Secondary | ICD-10-CM

## 2024-01-29 NOTE — Progress Notes (Signed)
 Subjective:   Caroline Bullock is a 68 y.o. who presents for a Medicare Wellness preventive visit.  As a reminder, Annual Wellness Visits don't include a physical exam, and some assessments may be limited, especially if this visit is performed virtually. We may recommend an in-person follow-up visit with your provider if needed.  Visit Complete: In person   Persons Participating in Visit: Patient.  AWV Questionnaire: Yes: Patient Medicare AWV questionnaire was completed by the patient on 01/28/2024; I have confirmed that all information answered by patient is correct and no changes since this date.  Cardiac Risk Factors include: advanced age (>89men, >78 women)     Objective:    Today's Vitals   01/29/24 1434  BP: 128/68  Pulse: 82  SpO2: 98%  Weight: 205 lb (93 kg)  Height: 5' 5 (1.651 m)   Body mass index is 34.11 kg/m.     01/29/2024    2:27 PM 06/18/2023    6:22 PM 09/07/2019    8:31 AM 12/20/2011   10:49 AM 12/14/2011    4:35 PM  Advanced Directives  Does Patient Have a Medical Advance Directive? Yes No Yes  Patient does not have advance directive;Patient would like information   Type of Advance Directive Healthcare Power of [place];Living will  Healthcare Power of [place];Living will    Does patient want to make changes to medical advance directive? No - Patient declined      Copy of Healthcare Power of Attorney in Chart? Yes - validated most recent copy scanned in chart (See row information)      Would patient like information on creating a medical advance directive?  No - Patient declined  Advance directive packet given  Advance directive packet given      Data saved with a previous flowsheet row definition    Current Medications (verified) No outpatient encounter medications on file as of 01/29/2024.   Facility-Administered Encounter Medications as of 01/29/2024  Medication   0.9 %  sodium chloride  infusion    Allergies (verified) Benadryl [diphenhydramine  hcl], Fish protein-containing drug products, Penicillins, Shellfish allergy, and Tramadol    History: Past Medical History:  Diagnosis Date   Acid indigestion    rare - TUMS as needed   Allergy    Anxiety    no current meds.   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   Dental crowns present    also a lower dental implant   PONV (postoperative nausea and vomiting)    Past Surgical History:  Procedure Laterality Date   CESAREAN SECTION     x 3   CHOLECYSTECTOMY     COLONOSCOPY  11/2012   MASS EXCISION  12/20/2011   Procedure: EXCISION MASS;  Surgeon: Norleen Armor, MD;  Location: [facility];  Service: Orthopedics;  Laterality: Right;   TONSILLECTOMY     TUBAL LIGATION     WISDOM TOOTH EXTRACTION     Family History  Problem Relation Age of Onset   Cancer Mother        spot on lung   Diabetes Mother    Heart disease Father        vascular   Diabetes Sister    Diabetes Maternal Grandmother    Heart disease Maternal Grandmother    Colon cancer Neg Hx    Rectal cancer Neg Hx    Stomach cancer Neg Hx    Social History   Socioeconomic History   Marital status: Married    Spouse  name: Not on file   Number of children: Not on file   Years of education: Not on file   Highest education level: Bachelor's degree (e.g., BA, AB, BS)  Occupational History   Not on file  Tobacco Use   Smoking status: Never   Smokeless tobacco: Never  Substance and Sexual Activity   Alcohol use: Yes    Alcohol/week: 3.0 - 5.0 standard drinks of alcohol    Types: 3 - 5 Glasses of wine per week    Comment: glass wine 2 x/week   Drug use: No   Sexual activity: Yes    Birth control/protection: None  Other Topics Concern   Not on file  Social History Narrative   Married. Education: [employer]. Exercise: Yes   Social Drivers of Health   Financial Resource Strain: Low Risk  (01/29/2024)   Overall Financial Resource Strain (CARDIA)    Difficulty of Paying Living Expenses: Not hard at all   Food Insecurity: No Food Insecurity (01/29/2024)   Hunger Vital Sign    Worried About Running Out of Food in the Last Year: Never true    Ran Out of Food in the Last Year: Never true  Transportation Needs: No Transportation Needs (01/29/2024)   PRAPARE - Administrator, Civil Service (Medical): No    Lack of Transportation (Non-Medical): No  Physical Activity: Insufficiently Active (01/29/2024)   Exercise Vital Sign    Days of Exercise per Week: 6 days    Minutes of Exercise per Session: 20 min  Stress: Stress Concern Present (01/29/2024)   [employer] of Occupational Health - Occupational Stress Questionnaire    Feeling of Stress: To some extent  Social Connections: Socially Integrated (01/29/2024)   Social Connection and Isolation Panel    Frequency of Communication with Friends and Family: More than three times a week    Frequency of Social Gatherings with Friends and Family: More than three times a week    Attends Religious Services: More than 4 times per year    Active Member of [employer] or Organizations: Yes    Attends Engineer, structural: More than 4 times per year    Marital Status: Married    Tobacco Counseling Counseling given: Not Answered    Clinical Intake:  Pre-visit preparation completed: Yes  Pain : No/denies pain     BMI - recorded: 34.11 Nutritional Status: BMI > 30  Obese Nutritional Risks: None Diabetes: No  Lab Results  Component Value Date   HGBA1C 6.2 08/22/2023   HGBA1C 5.9 11/23/2020   HGBA1C 5.8 (H) 06/01/2016     How often do you need to have someone help you when you read instructions, pamphlets, or other written materials from your doctor or pharmacy?: 1 - Never  Interpreter Needed?: No  Information entered by :: Verdie Saba, CMA   Activities of Daily Living     01/29/2024    2:37 PM 01/28/2024    5:53 PM  In your present state of health, do you have any difficulty performing the following  activities:  Hearing? 0 0  Vision? 0 0  Difficulty concentrating or making decisions? 0 0  Walking or climbing stairs? 0 0  Dressing or bathing? 0 0  Doing errands, shopping? 0 0  Preparing Food and eating ? N N  Using the Toilet? N N  In the past six months, have you accidently leaked urine? N N  Do you have problems with loss of bowel control? N N  Managing your Medications? N N  Managing your Finances? N N  Housekeeping or managing your Housekeeping? N N    Patient Care Team: Purcell Emil Schanz, MD as PCP - General (Internal Medicine) Glendia Simmonds, OD as Referring Physician (Optometry)  I have updated your Care Teams any recent Medical Services you may have received from other providers in the past year.     Assessment:   This is a routine wellness examination for [place].  Hearing/Vision screen Hearing Screening - Comments:: Denies hearing difficulties   Vision Screening - Comments:: Wears rx glasses - up to date with routine eye exams with Simmonds Glendia   Goals Addressed               This Visit's Progress     Patient Stated (pt-stated)        Patient stated she plans to continue managing her weight and cholesterol level       Depression Screen     01/29/2024    2:38 PM 08/22/2023    2:57 PM 11/23/2020    4:51 PM 08/25/2019    8:59 AM 06/24/2017    1:18 PM 10/18/2016    4:12 PM 06/01/2016   11:49 AM  PHQ 2/9 Scores  PHQ - 2 Score 0 0 0 0 0 0 0  PHQ- 9 Score 0          Fall Risk     01/29/2024    2:37 PM 01/28/2024    5:53 PM 08/21/2023   11:08 PM 11/23/2020    4:51 PM 09/07/2019    8:29 AM  Fall Risk   Falls in the past year? 1 0 0 0 0  Number falls in past yr: 0 0 0 0 0  Comment 2      Injury with Fall? 1 0  1 0  Comment rt ankle; head injury      Follow up Falls evaluation completed;Falls prevention discussed        MEDICARE RISK AT HOME:  Medicare Risk at Home Any stairs in or around the home?: Yes If so, are there any without handrails?: No Home  free of loose throw rugs in walkways, pet beds, electrical cords, etc?: Yes Adequate lighting in your home to reduce risk of falls?: Yes Life alert?: No Use of a cane, walker or w/c?: No Grab bars in the bathroom?: Yes Shower chair or bench in shower?: Yes Elevated toilet seat or a handicapped toilet?: Yes  TIMED UP AND GO:  Was the test performed?  No  Cognitive Function: 6CIT completed        01/29/2024    2:40 PM 09/07/2019    8:28 AM  6CIT Screen  What Year? 0 points 0 points  What month? 0 points 0 points  What time? 0 points 0 points  Count back from 20 0 points 0 points  Months in reverse 0 points 0 points  Repeat phrase 2 points 0 points  Total Score 2 points 0 points    Immunizations Immunization History  Administered Date(s) Administered   INFLUENZA, HIGH DOSE SEASONAL PF 12/16/2019, 03/20/2023   Influenza-Unspecified 02/15/2019   Moderna Covid-19 Vaccine Bivalent Booster 59yrs & up 12/21/2020   Moderna Sars-Covid-2 Vaccination 07/22/2020   PFIZER(Purple Top)SARS-COV-2 Vaccination 05/23/2019, 06/16/2019, 01/15/2020   PNEUMOCOCCAL CONJUGATE-20 11/23/2020   PPD Test 12/22/2019   Pfizer(Comirnaty)Fall Seasonal Vaccine 12 years and older 01/01/2022, 07/18/2022, 12/24/2022, 07/25/2023   Pneumococcal Conjugate-13 01/29/2019   Pneumococcal Polysaccharide-23 02/12/2020   Respiratory Syncytial  Virus Vaccine,Recomb Aduvanted(Arexvy) 02/15/2022   Tdap 12/28/2011, 06/18/2023   Zoster Recombinant(Shingrix) 01/26/2021, 05/04/2021   Zoster, Live 02/19/2016    Screening Tests Health Maintenance  Topic Date Due   Influenza Vaccine  11/15/2023   COVID-19 Vaccine (10 - 2025-26 season) 12/16/2023   Medicare Annual Wellness (AWV)  01/28/2025   Mammogram  05/12/2025   Colonoscopy  01/08/2027   DTaP/Tdap/Td (3 - Td or Tdap) 06/17/2033   Pneumococcal Vaccine: 50+ Years  Completed   DEXA SCAN  Completed   Hepatitis C Screening  Completed   Zoster Vaccines- Shingrix   Completed   Meningococcal B Vaccine  Aged Out    Health Maintenance Items Addressed: 01/29/2024   Additional Screening:  Vision Screening: Recommended annual ophthalmology exams for early detection of glaucoma and other disorders of the eye. Is the patient up to date with their annual eye exam?  Yes  Who is the provider or what is the name of the office in which the patient attends annual eye exams? Thom Hamilton of [facility]  Dental Screening: Recommended annual dental exams for proper oral hygiene  Community Resource Referral / Chronic Care Management: CRR required this visit?  No Patient plans to research a Spousal Support group catering to those dealing with loved ones with Parkinson's Disease (her Spouse).  CCM required this visit?  No   Plan:    I have personally reviewed and noted the following in the patient's chart:   Medical and social history Use of alcohol, tobacco or illicit drugs  Current medications and supplements including opioid prescriptions. Patient is not currently taking opioid prescriptions. Functional ability and status Nutritional status Physical activity Advanced directives List of other physicians Hospitalizations, surgeries, and ER visits in previous 12 months Vitals Screenings to include cognitive, depression, and falls Referrals and appointments  In addition, I have reviewed and discussed with patient certain preventive protocols, quality metrics, and best practice recommendations. A written personalized care plan for preventive services as well as general preventive health recommendations were provided to patient.   Verdie CHRISTELLA Saba, CMA   01/29/2024   After Visit Summary: (In Person-Declined) Patient declined AVS at this time.  Notes: Nothing significant to report at this time.

## 2024-01-29 NOTE — Patient Instructions (Addendum)
 [name],  Thank you for taking the time for your Medicare Wellness Visit. I appreciate your continued commitment to your health goals. Please review the care plan we discussed, and feel free to reach out if I can assist you further.  Medicare recommends these wellness visits once per year to help you and your care team stay ahead of potential health issues. These visits are designed to focus on prevention, allowing your provider to concentrate on managing your acute and chronic conditions during your regular appointments.  Please note that Annual Wellness Visits do not include a physical exam. Some assessments may be limited, especially if the visit was conducted virtually. If needed, we may recommend a separate in-person follow-up with your provider.  Ongoing Care Seeing your primary care provider every 3 to 6 months helps us  monitor your health and provide consistent, personalized care.   Referrals If a referral was made during today's visit and you haven't received any updates within two weeks, please contact the referred provider directly to check on the status.  Recommended Screenings:  Health Maintenance  Topic Date Due   Flu Shot  11/15/2023   COVID-19 Vaccine (10 - 2025-26 season) 12/16/2023   Medicare Annual Wellness Visit  01/28/2025   Breast Cancer Screening  05/12/2025   Colon Cancer Screening  01/08/2027   DTaP/Tdap/Td vaccine (3 - Td or Tdap) 06/17/2033   Pneumococcal Vaccine for age over 86  Completed   DEXA scan (bone density measurement)  Completed   Hepatitis C Screening  Completed   Zoster (Shingles) Vaccine  Completed   Meningitis B Vaccine  Aged Out       01/29/2024    2:27 PM  Advanced Directives  Does Patient Have a Medical Advance Directive? Yes  Type of Estate agent of [place];Living will  Does patient want to make changes to medical advance directive? No - Patient declined  Copy of Healthcare Power of Attorney in Chart? Yes -  validated most recent copy scanned in chart (See row information)   Advance Care Planning is important because it: Ensures you receive medical care that aligns with your values, goals, and preferences. Provides guidance to your family and loved ones, reducing the emotional burden of decision-making during critical moments.  Vision: Annual vision screenings are recommended for early detection of glaucoma, cataracts, and diabetic retinopathy. These exams can also reveal signs of chronic conditions such as diabetes and high blood pressure.  Dental: Annual dental screenings help detect early signs of oral cancer, gum disease, and other conditions linked to overall health, including heart disease and diabetes.

## 2024-02-11 ENCOUNTER — Ambulatory Visit: Payer: Self-pay | Admitting: General Surgery

## 2024-02-11 DIAGNOSIS — D121 Benign neoplasm of appendix: Secondary | ICD-10-CM | POA: Diagnosis not present

## 2024-02-11 NOTE — H&P (Signed)
 REFERRING PHYSICIAN:  Leigh Elspeth Mt*   PROVIDER:  BERNARDA WANDA NED, MD   MRN: [id number] DOB: 10-23-1955 DATE OF ENCOUNTER: 02/11/2024   Subjective    Chief Complaint: New Consultation       History of Present Illness: Caroline Bullock is a 68 y.o. female who is seen today as an office consultation at the request of Dr. Leigh for evaluation of New Consultation .  Patient recently underwent a screening colonoscopy.  She was found to have a 5 mm polyp in the appendiceal orifice protruding out.  Polyp was incompletely resected.  She is here today for further evaluation for extended appendectomy.  Pathology showed sessile serrated polyp.  She has no major medical issues.  She is status post C-section, tubal ligation and colonoscopy.     Review of Systems: A complete review of systems was obtained from the patient.  I have reviewed this information and discussed as appropriate with the patient.  See HPI as well for other ROS.     Medical History: Past Medical History      Past Medical History:  Diagnosis Date   Anxiety           Problem List  There is no problem list on file for this patient.      Past Surgical History       Past Surgical History:  Procedure Laterality Date   CESAREAN SECTION       CHOLECYSTECTOMY       TONSILLECTOMY            Allergies      Allergies  Allergen Reactions   Penicillin Hives        Medications Ordered Prior to Encounter        Current Outpatient Medications on File Prior to Visit  Medication Sig Dispense Refill   cholecalciferol (VITAMIN D3) 1000 unit capsule Take 1,000 Units by mouth once daily       cyanocobalamin (VITAMIN B12) 1000 MCG tablet Take 1,000 mcg by mouth once daily       red yeast rice 600 mg Tab Take by mouth        No current facility-administered medications on file prior to visit.        Family History       Family History  Problem Relation Age of Onset   Diabetes Mother     Obesity  Sister     Coronary Artery Disease (Blocked arteries around heart) Sister     Diabetes Sister     Obesity Brother          Tobacco Use History  Social History       Tobacco Use  Smoking Status Never  Smokeless Tobacco Never        Social History  Social History        Socioeconomic History   Marital status: Married  Tobacco Use   Smoking status: Never   Smokeless tobacco: Never  Substance and Sexual Activity   Alcohol use: Never   Drug use: Never    Social Drivers of Acupuncturist Strain: Low Risk  (01/29/2024)    Received from [facility]    Overall Financial Resource Strain (CARDIA)     How hard is it for you to pay for the very basics like food, housing, medical care, and heating?: Not hard at all  Food Insecurity: No Food Insecurity (01/29/2024)    Received from [facility]  Hunger Vital Sign     Within the past 12 months, you worried that your food would run out before you got the money to buy more.: Never true     Within the past 12 months, the food you bought just didn't last and you didn't have money to get more.: Never true  Transportation Needs: No Transportation Needs (01/29/2024)    Received from [facility] - Transportation     In the past 12 months, has lack of transportation kept you from medical appointments or from getting medications?: No     In the past 12 months, has lack of transportation kept you from meetings, work, or from getting things needed for daily living?: No  Physical Activity: Insufficiently Active (01/29/2024)    Received from [facility]    Exercise Vital Sign     On average, how many days per week do you engage in moderate to strenuous exercise (like a brisk walk)?: 6 days     On average, how many minutes do you engage in exercise at this level?: 20 min  Stress: Stress Concern Present (01/29/2024)    Received from [facility] - Occupational Stress  Questionnaire     Do you feel stress - tense, restless, nervous, or anxious, or unable to sleep at night because your mind is troubled all the time - these days?: To some extent  Social Connections: Socially Integrated (01/29/2024)    Received from [facility]    Social Connection and Isolation Panel     In a typical week, how many times do you talk on the phone with family, friends, or neighbors?: More than three times a week     How often do you get together with friends or relatives?: More than three times a week     How often do you attend church or religious services?: More than 4 times per year     Do you belong to any clubs or organizations such as church groups, unions, fraternal or athletic groups, or school groups?: Yes     How often do you attend meetings of the clubs or organizations you belong to?: More than 4 times per year     Are you married, widowed, divorced, separated, never married, or living with a partner?: Married  Housing Stability: Unknown (02/11/2024)    Housing Stability Vital Sign     Homeless in the Last Year: No        Objective:         Vitals:    02/11/24 0911  BP: 122/78  Pulse: 101  Temp: 36.6 C (97.9 F)  SpO2: 97%  Weight: 92.6 kg (204 lb 3.2 oz)  Height: 167.6 cm (5' 6)  PainSc: 0-No pain      Exam Gen: NAD Abd: soft       Labs, Imaging and Diagnostic Testing: Colonoscopy report and images reviewed.   Assessment and Plan:  Sessile serrated polyp of colon  (primary encounter diagnosis)     Patient with a appendiceal orifice polyp which was endoscopically incompletely resected.  I recommended extended appendectomy.  We discussed this in detail today including laparoscopic approach and risk of bleeding, infection (leak) and hernia.  All questions were answered.  Patient agrees and is willing to proceed.   Bernarda JAYSON Ned, MD Colon and Rectal Surgery [facility]

## 2024-02-11 NOTE — H&P (View-Only) (Signed)
 REFERRING PHYSICIAN:  Leigh Elspeth Mt*   PROVIDER:  BERNARDA WANDA NED, MD   MRN: [id number] DOB: 10-23-1955 DATE OF ENCOUNTER: 02/11/2024   Subjective    Chief Complaint: New Consultation       History of Present Illness: Caroline Bullock is a 68 y.o. female who is seen today as an office consultation at the request of Dr. Leigh for evaluation of New Consultation .  Patient recently underwent a screening colonoscopy.  She was found to have a 5 mm polyp in the appendiceal orifice protruding out.  Polyp was incompletely resected.  She is here today for further evaluation for extended appendectomy.  Pathology showed sessile serrated polyp.  She has no major medical issues.  She is status post C-section, tubal ligation and colonoscopy.     Review of Systems: A complete review of systems was obtained from the patient.  I have reviewed this information and discussed as appropriate with the patient.  See HPI as well for other ROS.     Medical History: Past Medical History      Past Medical History:  Diagnosis Date   Anxiety           Problem List  There is no problem list on file for this patient.      Past Surgical History       Past Surgical History:  Procedure Laterality Date   CESAREAN SECTION       CHOLECYSTECTOMY       TONSILLECTOMY            Allergies      Allergies  Allergen Reactions   Penicillin Hives        Medications Ordered Prior to Encounter        Current Outpatient Medications on File Prior to Visit  Medication Sig Dispense Refill   cholecalciferol (VITAMIN D3) 1000 unit capsule Take 1,000 Units by mouth once daily       cyanocobalamin (VITAMIN B12) 1000 MCG tablet Take 1,000 mcg by mouth once daily       red yeast rice 600 mg Tab Take by mouth        No current facility-administered medications on file prior to visit.        Family History       Family History  Problem Relation Age of Onset   Diabetes Mother     Obesity  Sister     Coronary Artery Disease (Blocked arteries around heart) Sister     Diabetes Sister     Obesity Brother          Tobacco Use History  Social History       Tobacco Use  Smoking Status Never  Smokeless Tobacco Never        Social History  Social History        Socioeconomic History   Marital status: Married  Tobacco Use   Smoking status: Never   Smokeless tobacco: Never  Substance and Sexual Activity   Alcohol use: Never   Drug use: Never    Social Drivers of Acupuncturist Strain: Low Risk  (01/29/2024)    Received from [facility]    Overall Financial Resource Strain (CARDIA)     How hard is it for you to pay for the very basics like food, housing, medical care, and heating?: Not hard at all  Food Insecurity: No Food Insecurity (01/29/2024)    Received from [facility]  Hunger Vital Sign     Within the past 12 months, you worried that your food would run out before you got the money to buy more.: Never true     Within the past 12 months, the food you bought just didn't last and you didn't have money to get more.: Never true  Transportation Needs: No Transportation Needs (01/29/2024)    Received from [facility] - Transportation     In the past 12 months, has lack of transportation kept you from medical appointments or from getting medications?: No     In the past 12 months, has lack of transportation kept you from meetings, work, or from getting things needed for daily living?: No  Physical Activity: Insufficiently Active (01/29/2024)    Received from [facility]    Exercise Vital Sign     On average, how many days per week do you engage in moderate to strenuous exercise (like a brisk walk)?: 6 days     On average, how many minutes do you engage in exercise at this level?: 20 min  Stress: Stress Concern Present (01/29/2024)    Received from [facility] - Occupational Stress  Questionnaire     Do you feel stress - tense, restless, nervous, or anxious, or unable to sleep at night because your mind is troubled all the time - these days?: To some extent  Social Connections: Socially Integrated (01/29/2024)    Received from [facility]    Social Connection and Isolation Panel     In a typical week, how many times do you talk on the phone with family, friends, or neighbors?: More than three times a week     How often do you get together with friends or relatives?: More than three times a week     How often do you attend church or religious services?: More than 4 times per year     Do you belong to any clubs or organizations such as church groups, unions, fraternal or athletic groups, or school groups?: Yes     How often do you attend meetings of the clubs or organizations you belong to?: More than 4 times per year     Are you married, widowed, divorced, separated, never married, or living with a partner?: Married  Housing Stability: Unknown (02/11/2024)    Housing Stability Vital Sign     Homeless in the Last Year: No        Objective:         Vitals:    02/11/24 0911  BP: 122/78  Pulse: 101  Temp: 36.6 C (97.9 F)  SpO2: 97%  Weight: 92.6 kg (204 lb 3.2 oz)  Height: 167.6 cm (5' 6)  PainSc: 0-No pain      Exam Gen: NAD Abd: soft       Labs, Imaging and Diagnostic Testing: Colonoscopy report and images reviewed.   Assessment and Plan:  Sessile serrated polyp of colon  (primary encounter diagnosis)     Patient with a appendiceal orifice polyp which was endoscopically incompletely resected.  I recommended extended appendectomy.  We discussed this in detail today including laparoscopic approach and risk of bleeding, infection (leak) and hernia.  All questions were answered.  Patient agrees and is willing to proceed.   Bernarda JAYSON Ned, MD Colon and Rectal Surgery [facility]

## 2024-02-21 NOTE — Progress Notes (Addendum)
 COVID Vaccine received:  []  No [x]  Yes Date of any COVID positive Test in last 48 days:NO  PCP - Emil Schaumann MD Cardiologist - N/A  Chest x-ray -  EKG -   Stress Test -  ECHO -  Cardiac Cath -   Bowel Prep - [x]  No  []   Yes ______  Pacemaker / ICD device [x]  No []  Yes   Spinal Cord Stimulator:[x]  No []  Yes       History of Sleep Apnea? [x]  No []  Yes   CPAP used?- [x]  No []  Yes    Does the patient monitor blood sugar?          [x]  No []  Yes  []  N/A  Patient has: [x]  NO Hx DM   []  Pre-DM                 []  DM1  []   DM2 Does patient have a [employer] or Dexacom? []  No []  Yes   Fasting Blood Sugar Ranges-  Checks Blood Sugar _____ times a day  GLP1 agonist / usual dose - NO GLP1 instructions:  SGLT-2 inhibitors / usual dose - NO SGLT-2 instructions:   Blood Thinner / Instructions:NO Aspirin Instructions:NO  Comments:   Activity level: Patient is able  to climb a flight of stairs without difficulty; [x]  No CP  [x]  No SOB,    Patient can perform ADLs without assistance.   Anesthesia review:   Patient denies shortness of breath, fever, cough and chest pain at PAT appointment.  Patient verbalized understanding and agreement to the Pre-Surgical Instructions that were given to them at this PAT appointment. Patient was also educated of the need to review these PAT instructions again prior to his/her surgery.I reviewed the appropriate phone numbers to call if they have any and questions or concerns.

## 2024-02-21 NOTE — Patient Instructions (Signed)
 SURGICAL WAITING ROOM VISITATION  Patients having surgery or a procedure may have no more than 2 support people in the waiting area - these visitors may rotate.    Children under the age of 58 must have an adult with them who is not the patient.  Visitors with respiratory illnesses are discouraged from visiting and should remain at home.  If the patient needs to stay at the hospital during part of their recovery, the visitor guidelines for inpatient rooms apply. Pre-op nurse will coordinate an appropriate time for 1 support person to accompany patient in pre-op.  This support person may not rotate.    Please refer to the [facility] website for the visitor guidelines for Inpatients (after your surgery is over and you are in a regular room).       Your procedure is scheduled on: 02/26/24   Report to [facility] Main Entrance    Report to admitting at 9:30 AM   Call this number if you have problems the morning of surgery [phone]   Do not eat food or drink liquids:After Midnight.but may have sips of water with meds.     FOLLOW BOWEL PREP AND ANY ADDITIONAL PRE OP INSTRUCTIONS YOU RECEIVED FROM YOUR SURGEON'S OFFICE!!!     Oral Hygiene is also important to reduce your risk of infection.                                    Remember - BRUSH YOUR TEETH THE MORNING OF SURGERY WITH YOUR REGULAR TOOTHPASTE    Stop all vitamins and herbal supplements 7 days before surgery.   Take these medicines the morning of surgery with A SIP OF WATER:              You may not have any metal on your body including hair pins, jewelry, and body piercing             Do not wear make-up, lotions, powders, perfumes/cologne, or deodorant  Do not wear nail polish including gel and S&S, artificial/acrylic nails, or any other type of covering on natural nails including finger and toenails. If you have artificial nails, gel coating, etc. that needs to be removed by a nail salon please have this  removed prior to surgery or surgery may need to be canceled/ delayed if the surgeon/ anesthesia feels like they are unable to be safely monitored.   Do not shave  48 hours prior to surgery.               Do not bring valuables to the hospital. [facility] IS NOT             RESPONSIBLE   FOR VALUABLES.   Contacts, glasses, dentures or bridgework may not be worn into surgery.    DO NOT BRING YOUR HOME MEDICATIONS TO THE HOSPITAL. PHARMACY WILL DISPENSE MEDICATIONS LISTED ON YOUR MEDICATION LIST TO YOU DURING YOUR ADMISSION IN THE HOSPITAL!    Patients discharged on the day of surgery will not be allowed to drive home.  Someone NEEDS to stay with you for the first 24 hours after anesthesia.   Special Instructions: Bring a copy of your healthcare power of attorney and living will documents the day of surgery if you haven't scanned them before.              Please read over the following fact sheets you were  given: IF YOU HAVE QUESTIONS ABOUT YOUR PRE-OP INSTRUCTIONS PLEASE CALL [phone] Caroline Bullock   If you received a COVID test during your pre-op visit  it is requested that you wear a mask when out in public, stay away from anyone that may not be feeling well and notify your surgeon if you develop symptoms. If you test positive for Covid or have been in contact with anyone that has tested positive in the last 10 days please notify you surgeon.    [facility] - Preparing for Surgery Before surgery, you can play an important role.  Because skin is not sterile, your skin needs to be as free of germs as possible.  You can reduce the number of germs on your skin by washing with CHG (chlorahexidine gluconate) soap before surgery.  CHG is an antiseptic cleaner which kills germs and bonds with the skin to continue killing germs even after washing. Please DO NOT use if you have an allergy to CHG or antibacterial soaps.  If your skin becomes reddened/irritated stop using the CHG and inform your nurse  when you arrive at Short Stay. Do not shave (including legs and underarms) for at least 48 hours prior to the first CHG shower.  You may shave your face/neck.  Please follow these instructions carefully:  1.  Shower with CHG Soap the night before surgery ONLY (DO NOT USE THE SOAP THE MORNING OF SURGERY).  2.  If you choose to wash your hair, wash your hair first as usual with your normal  shampoo.  3.  After you shampoo, rinse your hair and body thoroughly to remove the shampoo.                             4.  Use CHG as you would any other liquid soap.  You can apply chg directly to the skin and wash.  Gently with a scrungie or clean washcloth.  5.  Apply the CHG Soap to your body ONLY FROM THE NECK DOWN.   Do   not use on face/ open                           Wound or open sores. Avoid contact with eyes, ears mouth and   genitals (private parts).                       Wash face,  Genitals (private parts) with your normal soap.             6.  Wash thoroughly, paying special attention to the area where your    surgery  will be performed.  7.  Thoroughly rinse your body with warm water from the neck down.  8.  DO NOT shower/wash with your normal soap after using and rinsing off the CHG Soap.                9.  Pat yourself dry with a clean towel.            10.  Wear clean pajamas.            11.  Place clean sheets on your bed the night of your first shower and do not  sleep with pets. Day of Surgery : Do not apply any CHG, lotions/deodorants the morning of surgery.  Please wear clean clothes to the hospital/surgery center.  FAILURE TO FOLLOW  THESE INSTRUCTIONS MAY RESULT IN THE CANCELLATION OF YOUR SURGERY  PATIENT SIGNATURE_________________________________  NURSE SIGNATURE__________________________________  ________________________________________________________________________

## 2024-02-24 ENCOUNTER — Encounter (HOSPITAL_COMMUNITY): Payer: Self-pay

## 2024-02-24 ENCOUNTER — Encounter (HOSPITAL_COMMUNITY)
Admission: RE | Admit: 2024-02-24 | Discharge: 2024-02-24 | Disposition: A | Source: Ambulatory Visit | Attending: General Surgery

## 2024-02-24 ENCOUNTER — Other Ambulatory Visit: Payer: Self-pay

## 2024-02-24 VITALS — BP 144/65 | HR 87 | Temp 98.0°F | Resp 16 | Ht 66.0 in | Wt 200.0 lb

## 2024-02-24 DIAGNOSIS — Z130 Encounter for screening for diseases of the blood and blood-forming organs and certain disorders involving the immune mechanism: Secondary | ICD-10-CM | POA: Insufficient documentation

## 2024-02-24 DIAGNOSIS — Z01812 Encounter for preprocedural laboratory examination: Secondary | ICD-10-CM | POA: Diagnosis not present

## 2024-02-24 DIAGNOSIS — Z01818 Encounter for other preprocedural examination: Secondary | ICD-10-CM

## 2024-02-24 LAB — CBC
HCT: 41.9 % (ref 36.0–46.0)
Hemoglobin: 13.3 g/dL (ref 12.0–15.0)
MCH: 30.5 pg (ref 26.0–34.0)
MCHC: 31.7 g/dL (ref 30.0–36.0)
MCV: 96.1 fL (ref 80.0–100.0)
Platelets: 291 K/uL (ref 150–400)
RBC: 4.36 MIL/uL (ref 3.87–5.11)
RDW: 14 % (ref 11.5–15.5)
WBC: 6.6 K/uL (ref 4.0–10.5)
nRBC: 0 % (ref 0.0–0.2)

## 2024-02-26 ENCOUNTER — Encounter (HOSPITAL_COMMUNITY)
Admission: RE | Disposition: A | Discharge status: Home / Self Care | Payer: Self-pay | Source: Home / Self Care | Attending: General Surgery

## 2024-02-26 ENCOUNTER — Ambulatory Visit (HOSPITAL_COMMUNITY): Admitting: Anesthesiology

## 2024-02-26 ENCOUNTER — Encounter (HOSPITAL_COMMUNITY): Payer: Self-pay | Admitting: General Surgery

## 2024-02-26 ENCOUNTER — Ambulatory Visit (HOSPITAL_COMMUNITY)
Admission: RE | Admit: 2024-02-26 | Discharge: 2024-02-26 | Disposition: A | Discharge status: Home / Self Care | Attending: General Surgery | Admitting: General Surgery

## 2024-02-26 DIAGNOSIS — K635 Polyp of colon: Secondary | ICD-10-CM | POA: Insufficient documentation

## 2024-02-26 DIAGNOSIS — K388 Other specified diseases of appendix: Secondary | ICD-10-CM | POA: Diagnosis not present

## 2024-02-26 DIAGNOSIS — K37 Unspecified appendicitis: Secondary | ICD-10-CM

## 2024-02-26 DIAGNOSIS — D121 Benign neoplasm of appendix: Secondary | ICD-10-CM | POA: Diagnosis not present

## 2024-02-26 DIAGNOSIS — Z98891 History of uterine scar from previous surgery: Secondary | ICD-10-CM | POA: Diagnosis not present

## 2024-02-26 SURGERY — APPENDECTOMY, LAPAROSCOPIC
Anesthesia: General

## 2024-02-26 MED ORDER — BUPIVACAINE-EPINEPHRINE 0.5% -1:200000 IJ SOLN
INTRAMUSCULAR | Status: DC | PRN
  Administered 2024-02-26: 30 mL

## 2024-02-26 MED ORDER — SUGAMMADEX SODIUM 200 MG/2ML IV SOLN
INTRAVENOUS | Status: AC
  Filled 2024-02-26: qty 2

## 2024-02-26 MED ORDER — BUPIVACAINE-EPINEPHRINE (PF) 0.5% -1:200000 IJ SOLN
INTRAMUSCULAR | Status: AC
  Filled 2024-02-26: qty 30

## 2024-02-26 MED ORDER — FENTANYL CITRATE (PF) 100 MCG/2ML IJ SOLN
INTRAMUSCULAR | Status: DC | PRN
  Administered 2024-02-26 (×2): 50 ug via INTRAVENOUS

## 2024-02-26 MED ORDER — PROPOFOL 10 MG/ML IV BOLUS
INTRAVENOUS | Status: DC | PRN
  Administered 2024-02-26: 120 ug/kg/min via INTRAVENOUS
  Administered 2024-02-26: 125 ug/kg/min via INTRAVENOUS

## 2024-02-26 MED ORDER — PROPOFOL 10 MG/ML IV BOLUS
INTRAVENOUS | Status: AC
  Filled 2024-02-26: qty 20

## 2024-02-26 MED ORDER — MIDAZOLAM HCL 2 MG/2ML IJ SOLN
INTRAMUSCULAR | Status: AC
  Filled 2024-02-26: qty 2

## 2024-02-26 MED ORDER — PROPOFOL 1000 MG/100ML IV EMUL
INTRAVENOUS | Status: AC
  Filled 2024-02-26: qty 100

## 2024-02-26 MED ORDER — ROCURONIUM BROMIDE 100 MG/10ML IV SOLN
INTRAVENOUS | Status: DC | PRN
  Administered 2024-02-26: 50 mg via INTRAVENOUS

## 2024-02-26 MED ORDER — ACETAMINOPHEN 500 MG PO TABS
1000.0000 mg | ORAL_TABLET | ORAL | Status: AC
  Administered 2024-02-26: 1000 mg via ORAL
  Filled 2024-02-26: qty 2

## 2024-02-26 MED ORDER — SUGAMMADEX SODIUM 200 MG/2ML IV SOLN
INTRAVENOUS | Status: DC | PRN
  Administered 2024-02-26: 200 mg via INTRAVENOUS

## 2024-02-26 MED ORDER — MIDAZOLAM HCL 5 MG/5ML IJ SOLN
INTRAMUSCULAR | Status: DC | PRN
  Administered 2024-02-26: 1 mg via INTRAVENOUS

## 2024-02-26 MED ORDER — DROPERIDOL 2.5 MG/ML IJ SOLN
0.6250 mg | Freq: Once | INTRAMUSCULAR | Status: AC | PRN
  Administered 2024-02-26: 0.625 mg via INTRAVENOUS

## 2024-02-26 MED ORDER — DEXAMETHASONE SOD PHOSPHATE PF 10 MG/ML IJ SOLN
INTRAMUSCULAR | Status: DC | PRN
  Administered 2024-02-26: 4 mg via INTRAVENOUS

## 2024-02-26 MED ORDER — BUPIVACAINE-EPINEPHRINE (PF) 0.25% -1:200000 IJ SOLN
INTRAMUSCULAR | Status: AC
  Filled 2024-02-26: qty 30

## 2024-02-26 MED ORDER — FENTANYL CITRATE (PF) 50 MCG/ML IJ SOSY
25.0000 ug | PREFILLED_SYRINGE | INTRAMUSCULAR | Status: DC | PRN
  Administered 2024-02-26: 50 ug via INTRAVENOUS

## 2024-02-26 MED ORDER — CEFAZOLIN SODIUM-DEXTROSE 2-4 GM/100ML-% IV SOLN
2.0000 g | INTRAVENOUS | Status: AC
  Administered 2024-02-26: 2 g via INTRAVENOUS
  Filled 2024-02-26: qty 100

## 2024-02-26 MED ORDER — ROCURONIUM BROMIDE 10 MG/ML (PF) SYRINGE
PREFILLED_SYRINGE | INTRAVENOUS | Status: AC
  Filled 2024-02-26: qty 10

## 2024-02-26 MED ORDER — LACTATED RINGERS IV SOLN: INTRAVENOUS | Status: DC

## 2024-02-26 MED ORDER — SODIUM CHLORIDE 0.9% FLUSH: 3.0000 mL | Freq: Two times a day (BID) | INTRAVENOUS | Status: DC

## 2024-02-26 MED ORDER — GLYCOPYRROLATE 0.2 MG/ML IJ SOLN
INTRAMUSCULAR | Status: DC | PRN
  Administered 2024-02-26: .1 mg via INTRAVENOUS

## 2024-02-26 MED ORDER — DROPERIDOL 2.5 MG/ML IJ SOLN
INTRAMUSCULAR | Status: AC
  Filled 2024-02-26: qty 2

## 2024-02-26 MED ORDER — LIDOCAINE HCL (PF) 2 % IJ SOLN
INTRAMUSCULAR | Status: AC
  Filled 2024-02-26: qty 5

## 2024-02-26 MED ORDER — ORAL CARE MOUTH RINSE: 15.0000 mL | Freq: Once | OROMUCOSAL | Status: AC

## 2024-02-26 MED ORDER — LACTATED RINGERS IR SOLN
Status: DC | PRN
  Administered 2024-02-26: 1000 mL

## 2024-02-26 MED ORDER — FENTANYL CITRATE (PF) 100 MCG/2ML IJ SOLN
INTRAMUSCULAR | Status: AC
  Filled 2024-02-26: qty 2

## 2024-02-26 MED ORDER — LIDOCAINE HCL (CARDIAC) PF 100 MG/5ML IV SOSY
PREFILLED_SYRINGE | INTRAVENOUS | Status: DC | PRN
  Administered 2024-02-26: 100 mg via INTRAVENOUS

## 2024-02-26 MED ORDER — GABAPENTIN 300 MG PO CAPS
300.0000 mg | ORAL_CAPSULE | ORAL | Status: AC
  Administered 2024-02-26: 300 mg via ORAL
  Filled 2024-02-26: qty 1

## 2024-02-26 MED ORDER — TRAMADOL HCL 50 MG PO TABS: 50.0000 mg | ORAL_TABLET | Freq: Four times a day (QID) | ORAL | 0 refills | Status: DC | PRN

## 2024-02-26 MED ORDER — ONDANSETRON HCL 4 MG PO TABS: 4.0000 mg | ORAL_TABLET | Freq: Three times a day (TID) | ORAL | 0 refills | Status: AC | PRN

## 2024-02-26 MED ORDER — FENTANYL CITRATE (PF) 50 MCG/ML IJ SOSY
PREFILLED_SYRINGE | INTRAMUSCULAR | Status: AC
  Filled 2024-02-26: qty 1

## 2024-02-26 MED ORDER — ONDANSETRON HCL 4 MG/2ML IJ SOLN
INTRAMUSCULAR | Status: AC
  Filled 2024-02-26: qty 2

## 2024-02-26 MED ORDER — 0.9 % SODIUM CHLORIDE (POUR BTL) OPTIME
TOPICAL | Status: DC | PRN
  Administered 2024-02-26: 1000 mL

## 2024-02-26 MED ORDER — ONDANSETRON HCL 4 MG/2ML IJ SOLN
INTRAMUSCULAR | Status: DC | PRN
  Administered 2024-02-26: 4 mg via INTRAVENOUS

## 2024-02-26 MED ORDER — CHLORHEXIDINE GLUCONATE 0.12 % MT SOLN
15.0000 mL | Freq: Once | OROMUCOSAL | Status: AC
  Administered 2024-02-26: 15 mL via OROMUCOSAL

## 2024-02-26 SURGICAL SUPPLY — 32 items
BAG COUNTER SPONGE SURGICOUNT (BAG) IMPLANT
CHLORAPREP W/TINT 26 (MISCELLANEOUS) ×1 IMPLANT
CLIP APPLIE 5 13 M/L LIGAMAX5 (MISCELLANEOUS) IMPLANT
CLIP APPLIE ROT 10 11.4 M/L (STAPLE) IMPLANT
DERMABOND ADVANCED .7 DNX12 (GAUZE/BANDAGES/DRESSINGS) ×1 IMPLANT
ELECT REM PT RETURN 15FT ADLT (MISCELLANEOUS) ×1 IMPLANT
ENDOLOOP SUT PDS II 0 18 (SUTURE) IMPLANT
GLOVE BIO SURGEON STRL SZ 6.5 (GLOVE) ×1 IMPLANT
GLOVE INDICATOR 6.5 STRL GRN (GLOVE) ×1 IMPLANT
GOWN STRL REUS W/ TWL XL LVL3 (GOWN DISPOSABLE) ×1 IMPLANT
GRASPER SUT TROCAR 14GX15 (MISCELLANEOUS) IMPLANT
IRRIGATION SUCT STRKRFLW 2 WTP (MISCELLANEOUS) ×1 IMPLANT
KIT BASIN OR (CUSTOM PROCEDURE TRAY) ×1 IMPLANT
KIT TURNOVER KIT A (KITS) ×1 IMPLANT
PENCIL SMOKE EVACUATOR (MISCELLANEOUS) IMPLANT
RELOAD STAPLE 60 2.6 WHT THN (STAPLE) IMPLANT
RELOAD STAPLE 60 3.6 BLU REG (STAPLE) IMPLANT
SCISSORS LAP 5X35 DISP (ENDOMECHANICALS) IMPLANT
SET TUBE SMOKE EVAC HIGH FLOW (TUBING) ×1 IMPLANT
SHEARS HARMONIC 36 ACE (MISCELLANEOUS) IMPLANT
SLEEVE ADV FIXATION 5X100MM (TROCAR) ×1 IMPLANT
SPIKE FLUID TRANSFER (MISCELLANEOUS) ×1 IMPLANT
STAPLE ECHEON FLEX 60 POW ENDO (STAPLE) ×1 IMPLANT
SUT VIC AB 2-0 SH 27X BRD (SUTURE) IMPLANT
SUT VIC AB 4-0 PS2 27 (SUTURE) ×1 IMPLANT
SUT VICRYL 0 UR6 27IN ABS (SUTURE) ×1 IMPLANT
SYSTEM BAG RETRIEVAL 10MM (BASKET) ×1 IMPLANT
TOWEL OR 17X26 10 PK STRL BLUE (TOWEL DISPOSABLE) ×1 IMPLANT
TRAY FOLEY MTR SLVR 16FR STAT (SET/KITS/TRAYS/PACK) ×1 IMPLANT
TRAY LAPAROSCOPIC (CUSTOM PROCEDURE TRAY) ×1 IMPLANT
TROCAR ADV FIXATION 5X100MM (TROCAR) ×1 IMPLANT
TROCAR BALLN 12MMX100 BLUNT (TROCAR) ×1 IMPLANT

## 2024-02-26 NOTE — Op Note (Signed)
 Caroline Bullock [id number]   PRE-OPERATIVE DIAGNOSIS:  COLON POLYP  POST-OPERATIVE DIAGNOSIS:  COLON POLYP   Procedure(s):LAPAROSCOPIC EXTENDED APPENDECTOMY     Surgeon(s): Debby Hila, MD  ASSISTANT: none   ANESTHESIA:   local and general  EBL:  5ml  Delay start of Pharmacological VTE agent (>24hrs) due to surgical blood loss or risk of bleeding:  no  SPECIMEN:  Source of Specimen:  appendix  DISPOSITION OF SPECIMEN:  PATHOLOGY  COUNTS:  YES  PLAN OF CARE: Discharge to home after PACU  PATIENT DISPOSITION:  PACU - hemodynamically stable.   INDICATIONS: Patient with concerning symptoms & work up suspicious for appendicitis.  Surgery was recommended:  The anatomy & physiology of the digestive tract was discussed.  The pathophysiology of appendicitis was discussed.  Natural history risks without surgery was discussed.   I feel the risks of no intervention will lead to serious problems that outweigh the operative risks; therefore, I recommended diagnostic laparoscopy with removal of appendix to remove the pathology.  Laparoscopic & open techniques were discussed.   I noted a good likelihood this will help address the problem.    Risks such as bleeding, infection, abscess, leak, reoperation, possible ostomy, hernia, heart attack, death, and other risks were discussed.  Goals of post-operative recovery were discussed as well.  We will work to minimize complications.  Questions were answered.  The patient expresses understanding & wishes to proceed with surgery.  OR FINDINGS: 68 y.o. F with appendiceal orifice polyp unable to be completely resected endoscopically  DESCRIPTION:   The patient was identified & brought into the operating room. The patient was positioned supine with left arm tucked. SCDs were active during the entire case. The patient underwent general anesthesia without any difficulty.  A foley catheter was inserted under sterile conditions. The abdomen was prepped and  draped in a sterile fashion. A Surgical Timeout confirmed our plan.   I made a transverse incision through the inferior umbilical fold.  I made a nick in the infraumbilical fascia and confirmed peritoneal entry.  I placed a stay suture and then the [facility] port.  We induced carbon dioxide insufflation.  Camera inspection revealed no injury.  I placed additional ports under direct laparoscopic visualization.  I mobilized the terminal ileum to proximal ascending colon in a lateral to medial fashion.  I took care to avoid injuring any retroperitoneal structures.  I freed the appendix off its attachments to the ascending colon and cecal mesentery.  I elevated the appendix.  I was able to free off the base of the appendix, which was still viable.  I stapled the appendix  with a cuff of cecum using a laparoscopic blue load stapler. I ligated the mesoappendix with a white load stapler.   I placed the appendix inside an EndoCatch bag and removed out the [place] port.  I did copious irrigation. Hemostasis was good in the mesoappendix, colon mesentery, and retroperitoneum. Staple line was intact on the cecum with no bleeding. I washed out the pelvis, retrohepatic space and right paracolic gutter.  Hemostasis is good. There was no perforation or injury.   I aspirated the carbon dioxide. I removed the ports. I closed the umbilical fascia site using a 0 Vicryl stitch. I close the subcutaneous tissue with 2-0 Vicryl sutures.  I closed skin using 4-0 vicryl stitch.  Sterile glue was applied.  Patient was extubated and sent to the recovery room.  I discussed the operative findings with the patient's family.  Questions  answered. They expressed understanding and appreciation.   Bernarda JAYSON Ned, MD  Colorectal and General Surgery [facility]

## 2024-02-26 NOTE — Transfer of Care (Signed)
 Immediate Anesthesia Transfer of Care Note  Patient: Caroline Bullock  Procedure(s) Performed: APPENDECTOMY, LAPAROSCOPIC  Patient Location: PACU  Anesthesia Type:General  Level of Consciousness: awake, alert , and oriented  Airway & Oxygen Therapy: Patient Spontanous Breathing and Patient connected to face mask oxygen  Post-op Assessment: Report given to RN and Post -op Vital signs reviewed and stable  Post vital signs: Reviewed and stable  Last Vitals:  Vitals Value Taken Time  BP 114/62 02/26/24 12:27  Temp    Pulse 68 02/26/24 12:30  Resp 10 02/26/24 12:30  SpO2 100 % 02/26/24 12:30  Vitals shown include unfiled device data.  Last Pain:  Vitals:   02/26/24 1004  TempSrc: Oral  PainSc: 0-No pain         Complications: No notable events documented.

## 2024-02-26 NOTE — Anesthesia Procedure Notes (Signed)
 Procedure Name: Intubation Date/Time: 02/26/2024 11:32 AM  Performed by: Erick Fitz, CRNAPre-anesthesia Checklist: Patient identified, Emergency Drugs available, Suction available, Patient being monitored and Timeout performed Patient Re-evaluated:Patient Re-evaluated prior to induction Oxygen Delivery Method: Circle system utilized Preoxygenation: Pre-oxygenation with 100% oxygen Induction Type: IV induction Ventilation: Mask ventilation without difficulty Laryngoscope Size: Mac and 3 Grade View: Grade I Tube type: Oral Tube size: 7.0 mm Number of attempts: 1 Airway Equipment and Method: Stylet Placement Confirmation: ETT inserted through vocal cords under direct vision, positive ETCO2, CO2 detector and breath sounds checked- equal and bilateral Secured at: 22 cm Tube secured with: Tape (secured with white 1/2 inch silk tape) Dental Injury: Teeth and Oropharynx as per pre-operative assessment

## 2024-02-26 NOTE — Discharge Instructions (Addendum)
LAPAROSCOPIC SURGERY: POST OP INSTRUCTIONS  DIET: Follow a light bland diet the first 24 hours after arrival home, such as soup, liquids, crackers, etc.  Be sure to include lots of fluids daily.  Avoid fast food or heavy meals as your are more likely to get nauseated.  Eat a low fat the next few days after surgery.   Take your usually prescribed home medications unless otherwise directed. PAIN CONTROL: Pain is best controlled by a usual combination of three different methods TOGETHER: Ice/Heat Over the counter pain medication Prescription pain medication Most patients will experience some swelling and bruising around the incisions.  Ice packs or heating pads (30-60 minutes up to 6 times a day) will help. Use ice for the first few days to help decrease swelling and bruising, then switch to heat to help relax tight/sore spots and speed recovery.  Some people prefer to use ice alone, heat alone, alternating between ice & heat.  Experiment to what works for you.  Swelling and bruising can take several weeks to resolve.   It is helpful to take an over-the-counter pain medication regularly for the first few weeks.  Choose one of the following that works best for you: Naproxen (Aleve, etc)  Two 220mg tabs twice a day Ibuprofen (Advil, etc) Three 200mg tabs four times a day (every meal & bedtime) A  prescription for pain medication (such as percocet, vicodin, oxycodone, hydrocodone, etc) should be given to you upon discharge.  Take your pain medication as prescribed.  If you are having problems/concerns with the prescription medicine (does not control pain, nausea, vomiting, rash, itching, etc), please call us [phone] to see if we need to switch you to a different pain medicine that will work better for you and/or control your side effect better. If you need a refill on your pain medication, please contact your pharmacy.  They will contact our office to request authorization. Prescriptions will not be  filled after 5 pm or on week-ends.   Avoid getting constipated.  Between the surgery and the pain medications, it is common to experience some constipation.  Increasing fluid intake and taking a fiber supplement (such as Metamucil, Citrucel, FiberCon, MiraLax, etc) 1-2 times a day regularly will usually help prevent this problem from occurring.  A mild laxative (prune juice, Milk of Magnesia, MiraLax, etc) should be taken according to package directions if there are no bowel movements after 48 hours.   Watch out for diarrhea.  If you have many loose bowel movements, simplify your diet to bland foods & liquids for a few days.  Stop any stool softeners and decrease your fiber supplement.  Switching to mild anti-diarrheal medications (Kayopectate, Pepto Bismol) can help.  If this worsens or does not improve, please call us. Wash / shower every day.  You may shower over the dressings as they are waterproof.  Continue to shower over incision(s) after the dressing is off. Remove your waterproof bandages 5 days after surgery.  You may leave the incision open to air.  You may replace a dressing/Band-Aid to cover the incision for comfort if you wish.  ACTIVITIES as tolerated:   You may resume regular (light) daily activities beginning the next day--such as daily self-care, walking, climbing stairs--gradually increasing activities as tolerated.  If you can walk 30 minutes without difficulty, it is safe to try more intense activity such as jogging, treadmill, bicycling, low-impact aerobics, swimming, etc. Save the most intensive and strenuous activity for last such as sit-ups, heavy   lifting, contact sports, etc  Refrain from any heavy lifting or straining until you are off narcotics for pain control.   DO NOT PUSH THROUGH PAIN.  Let pain be your guide: If it hurts to do something, don't do it.  Pain is your body warning you to avoid that activity for another week until the pain goes down. You may drive when you are  no longer taking prescription pain medication, you can comfortably wear a seatbelt, and you can safely maneuver your car and apply brakes. You may have sexual intercourse when it is comfortable.  FOLLOW UP in our office Please call [facility] at [phone] to set up an appointment to see your surgeon in the office for a follow-up appointment approximately 2-3 weeks after your surgery. Make sure that you call for this appointment the day you arrive home to insure a convenient appointment time. 10. IF YOU HAVE DISABILITY OR FAMILY LEAVE FORMS, BRING THEM TO THE OFFICE FOR PROCESSING.  DO NOT GIVE THEM TO YOUR DOCTOR.   WHEN TO CALL US [phone]: Poor pain control Reactions / problems with new medications (rash/itching, nausea, etc)  Fever over 101.5 F (38.5 C) Inability to urinate Nausea and/or vomiting Worsening swelling or bruising Continued bleeding from incision. Increased pain, redness, or drainage from the incision   The clinic staff is available to answer your questions during regular business hours (8:30am-5pm).  Please don't hesitate to call and ask to speak to one of our nurses for clinical concerns.   If you have a medical emergency, go to the nearest emergency room or call 911.  A surgeon from [facility] is always on call at the hospitals   [facility] [address], [address], [place], [place]  [address] ? MAIN: [phone] ? TOLL FREE: [phone] ?  FAX [phone] [email or web address]   

## 2024-02-26 NOTE — Interval H&P Note (Signed)
 History and Physical Interval Note:  02/26/2024 9:35 AM  Caroline Bullock  has presented today for surgery, with the diagnosis of COLON POLYP.  The various methods of treatment have been discussed with the patient and family. After consideration of risks, benefits and other options for treatment, the patient has consented to  Procedure(s): APPENDECTOMY, LAPAROSCOPIC (N/A) as a surgical intervention.  The patient's history has been reviewed, patient examined, no change in status, stable for surgery.  I have reviewed the patient's chart and labs.  Questions were answered to the patient's satisfaction.     Bernarda JAYSON Ned, MD  Colorectal and General Surgery [facility] Surgery

## 2024-02-26 NOTE — Anesthesia Preprocedure Evaluation (Signed)
 Anesthesia Evaluation  Patient identified by MRN, date of birth, ID band Patient awake    Reviewed: Allergy & Precautions, NPO status , Patient's Chart, lab work & pertinent test results  History of Anesthesia Complications (+) PONV and history of anesthetic complications  Airway Mallampati: III  TM Distance: >3 FB Neck ROM: Full    Dental no notable dental hx. (+) Teeth Intact, Caps   Pulmonary neg pulmonary ROS, neg sleep apnea, neg COPD, Patient abstained from smoking.Not current smoker   Pulmonary exam normal breath sounds clear to auscultation       Cardiovascular Exercise Tolerance: Good METS(-) hypertension(-) CAD and (-) Past MI negative cardio ROS (-) dysrhythmias  Rhythm:Regular Rate:Normal - Systolic murmurs    Neuro/Psych  PSYCHIATRIC DISORDERS Anxiety     negative neurological ROS     GI/Hepatic ,neg GERD  ,,(+)     (-) substance abuse    Endo/Other  neg diabetes    Renal/GU negative Renal ROS     Musculoskeletal   Abdominal  (+) + obese  Peds  Hematology   Anesthesia Other Findings Past Medical History: No date: Acid indigestion     Comment:  rare - TUMS as needed No date: Allergy No date: Anxiety     Comment:  no current meds. 11/2011: Benign tumor     Comment:  right ankle No date: Dental crowns present     Comment:  also a lower dental implant No date: PONV (postoperative nausea and vomiting)  Reproductive/Obstetrics                              Anesthesia Physical Anesthesia Plan  ASA: 2  Anesthesia Plan: General   Post-op Pain Management: Tylenol  PO (pre-op)*, Gabapentin PO (pre-op)* and Toradol IV (intra-op)*   Induction: Intravenous  PONV Risk Score and Plan: 4 or greater and Ondansetron , Dexamethasone , TIVA and Propofol  infusion  Airway Management Planned: Oral ETT  Additional Equipment: None  Intra-op Plan:   Post-operative Plan: Extubation in  OR  Informed Consent: I have reviewed the patients History and Physical, chart, labs and discussed the procedure including the risks, benefits and alternatives for the proposed anesthesia with the patient or authorized representative who has indicated his/her understanding and acceptance.     Dental advisory given  Plan Discussed with: CRNA and Surgeon  Anesthesia Plan Comments: (Discussed risks of anesthesia with patient, including PONV, sore throat, lip/dental/eye damage, post operative cognitive dysfunction. Rare risks discussed as well, such as cardiorespiratory and neurological sequelae, and allergic reactions. Discussed the role of CRNA in patient's perioperative care. Patient understands.)        Anesthesia Quick Evaluation

## 2024-02-26 NOTE — Anesthesia Postprocedure Evaluation (Signed)
 Anesthesia Post Note  Patient: Caroline Bullock  Procedure(s) Performed: APPENDECTOMY, LAPAROSCOPIC     Patient location during evaluation: PACU Anesthesia Type: General Level of consciousness: awake and alert Pain management: pain level controlled Vital Signs Assessment: post-procedure vital signs reviewed and stable Respiratory status: spontaneous breathing, nonlabored ventilation, respiratory function stable and patient connected to nasal cannula oxygen Cardiovascular status: blood pressure returned to baseline and stable Postop Assessment: no apparent nausea or vomiting Anesthetic complications: no   No notable events documented.  Last Vitals:  Vitals:   02/26/24 1245 02/26/24 1300  BP: 105/75 102/63  Pulse: 60 60  Resp: 14 15  Temp:    SpO2: 96% (!) 88%    Last Pain:  Vitals:   02/26/24 1300  TempSrc:   PainSc: Asleep                 Rome Ade

## 2024-02-27 ENCOUNTER — Encounter (HOSPITAL_COMMUNITY): Payer: Self-pay | Admitting: General Surgery

## 2024-02-27 LAB — SURGICAL PATHOLOGY

## 2024-02-28 NOTE — Progress Notes (Signed)
 Surgery

## 2024-03-03 ENCOUNTER — Telehealth: Payer: Self-pay | Admitting: *Deleted

## 2024-03-03 DIAGNOSIS — D121 Benign neoplasm of appendix: Secondary | ICD-10-CM

## 2024-03-03 MED ORDER — NA SULFATE-K SULFATE-MG SULF 17.5-3.13-1.6 GM/177ML PO SOLN: ORAL | 0 refills | Status: DC

## 2024-03-03 NOTE — Telephone Encounter (Signed)
 Left message for patient to call back

## 2024-03-03 NOTE — Telephone Encounter (Signed)
-----   Message from Elspeth SHAUNNA Naval sent at 02/29/2024  9:53 AM EST ----- Regarding: FW: appendectomy path I'd like to schedule this patient for colonoscopy end of Jan or early Feb if you can help with this. Can you let her know it is to make sure the polyp was completely removed with the surgery. Thanks ----- Message ----- From: Debby Hila, MD Sent: 02/29/2024   8:56 AM EST To: Elspeth SHAUNNA Naval, MD Subject: RE: appendectomy path                          Ok, sounds good.  Whatever you feel is best is fine with me.  I'll just let her know that your office will contact her with any further plans.   Hila ----- Message ----- From: Naval Elspeth SHAUNNA, MD Sent: 02/29/2024   8:42 AM EST To: Hila Debby, MD Subject: RE: appendectomy path                          Walterine Hila, thanks for taking care of her. It's possible I got it all but based on my note / photos, I had thought there was a margin that was extending into the AO. I can certainly repeat a colonoscopy for her in a few months to make sure it looks okay.  Marcey ----- Message ----- From: Debby Hila, MD Sent: 02/28/2024  11:57 AM EST To: Elspeth SHAUNNA Naval, MD Subject: appendectomy path                              Marcey, I took all of her cecum distal to the IC valve with the appendix and they're saying there is no polyp in the specimen.  It looks like the polyp was well inside the opening on your pictures.  Do you think you just got it all with your biopsy or should we be concerned that the polyp is still inside the cecum?  Thanks  Alicia

## 2024-03-03 NOTE — Telephone Encounter (Signed)
 I have spoken to patient to advise of Dr Debby and Dr Hassan recommendation to have a repeat colonoscopy for reassurance that appendiceal polyp has been completely removed. Patient is in agreement with this plan. She has scheduled a colonoscopy in [facility] with Dr Leigh for 05/13/24 at 1030 am. Patient has been advised of time/date/location for upcoming procedure and has been given generalized verbal prep instructions. Discussed that a care partner 18 years or older should bring her, stay for the procedure and drive home due to sedation. Written instructions have been made available to the patient for additional review via mychart.

## 2024-05-13 ENCOUNTER — Ambulatory Visit: Admitting: Gastroenterology

## 2024-05-13 ENCOUNTER — Encounter: Payer: Self-pay | Admitting: Gastroenterology

## 2024-05-13 VITALS — BP 104/59 | HR 80 | Temp 97.6°F | Resp 12 | Ht 66.0 in | Wt 207.0 lb

## 2024-05-13 DIAGNOSIS — K644 Residual hemorrhoidal skin tags: Secondary | ICD-10-CM | POA: Diagnosis not present

## 2024-05-13 DIAGNOSIS — D123 Benign neoplasm of transverse colon: Secondary | ICD-10-CM

## 2024-05-13 DIAGNOSIS — K648 Other hemorrhoids: Secondary | ICD-10-CM | POA: Diagnosis not present

## 2024-05-13 DIAGNOSIS — D120 Benign neoplasm of cecum: Secondary | ICD-10-CM

## 2024-05-13 DIAGNOSIS — Z860100 Personal history of colon polyps, unspecified: Secondary | ICD-10-CM

## 2024-05-13 DIAGNOSIS — D125 Benign neoplasm of sigmoid colon: Secondary | ICD-10-CM

## 2024-05-13 DIAGNOSIS — D121 Benign neoplasm of appendix: Secondary | ICD-10-CM

## 2024-05-13 MED ORDER — SODIUM CHLORIDE 0.9 % IV SOLN: 500.0000 mL | INTRAVENOUS | Status: DC

## 2024-05-13 NOTE — Patient Instructions (Addendum)
 Resume previous diet Continue present medications Await pathology results See handouts for polyps, diverticulosis and hemorrhoids  YOU HAD AN ENDOSCOPIC PROCEDURE TODAY AT THE [facility]:   Refer to the procedure report that was given to you for any specific questions about what was found during the examination.  If the procedure report does not answer your questions, please call your gastroenterologist to clarify.  If you requested that your care partner not be given the details of your procedure findings, then the procedure report has been included in a sealed envelope for you to review at your convenience later.  YOU SHOULD EXPECT: Some feelings of bloating in the abdomen. Passage of more gas than usual.  Walking can help get rid of the air that was put into your GI tract during the procedure and reduce the bloating. If you had a lower endoscopy (such as a colonoscopy or flexible sigmoidoscopy) you may notice spotting of blood in your stool or on the toilet paper. If you underwent a bowel prep for your procedure, you may not have a normal bowel movement for a few days.  Please Note:  You might notice some irritation and congestion in your nose or some drainage.  This is from the oxygen used during your procedure.  There is no need for concern and it should clear up in a day or so.  SYMPTOMS TO REPORT IMMEDIATELY:  Following lower endoscopy (colonoscopy or flexible sigmoidoscopy):  Excessive amounts of blood in the stool  Significant tenderness or worsening of abdominal pains  Swelling of the abdomen that is new, acute  Fever of 100F or higher  For urgent or emergent issues, a gastroenterologist can be reached at any hour by calling [phone]. Do not use MyChart messaging for urgent concerns.   DIET:  We do recommend a small meal at first, but then you may proceed to your regular diet.  Drink plenty of fluids but you should avoid alcoholic beverages for 24  hours.  ACTIVITY:  You should plan to take it easy for the rest of today and you should NOT DRIVE or use heavy machinery until tomorrow (because of the sedation medicines used during the test).    FOLLOW UP: Our staff will call the number listed on your records the next business day following your procedure.  We will call around 7:15- 8:00 am to check on you and address any questions or concerns that you may have regarding the information given to you following your procedure. If we do not reach you, we will leave a message.     If any biopsies were taken you will be contacted by phone or by letter within the next 1-3 weeks.  Please call us  at [phone] if you have not heard about the biopsies in 3 weeks.   SIGNATURES/CONFIDENTIALITY: You and/or your care partner have signed paperwork which will be entered into your electronic medical record.  These signatures attest to the fact that that the information above on your After Visit Summary has been reviewed and is understood.  Full responsibility of the confidentiality of this discharge information lies with you and/or your care-partner.

## 2024-05-13 NOTE — Op Note (Signed)
 [facility] Patient Name: Caroline Bullock Procedure Date: 05/13/2024 11:24 AM MRN: [id number] Endoscopist: Elspeth P. Leigh , MD, [id number] Age: 69 Referring MD:  Date of Birth: [birth year] Gender: Female Account #: [id number] Procedure:                Colonoscopy Indications:              High risk colon cancer surveillance: Personal                            history of colonic polyps - multiple polyps removed                            Sept 2025 - one invading the AO (sessile serrated                            lesion) thought to be incompletely removed at time                            of colonoscopy. Patient is s/p appendectomy and                            removal of small portion of the cecum, pathology                            revealed no polypoid tissue. Close surveillance                            colonoscopy to reassess and ensure no residual                            polypoid tissue Medicines:                Monitored Anesthesia Care Procedure:                Pre-Anesthesia Assessment:                           - Prior to the procedure, a History and Physical                            was performed, and patient medications and                            allergies were reviewed. The patient's tolerance of                            previous anesthesia was also reviewed. The risks                            and benefits of the procedure and the sedation                            options and risks were discussed with the patient.  All questions were answered, and informed consent                            was obtained. Prior Anticoagulants: The patient has                            taken no anticoagulant or antiplatelet agents. ASA                            Grade Assessment: II - A patient with mild systemic                            disease. After reviewing the risks and benefits,                            the patient was deemed  in satisfactory condition to                            undergo the procedure.                           After obtaining informed consent, the colonoscope                            was passed under direct vision. Throughout the                            procedure, the patient's blood pressure, pulse, and                            oxygen saturations were monitored continuously. The                            CF [id number] #[id number] was introduced through the anus                            and advanced to the the cecum, identified by                            appendiceal orifice and ileocecal valve. The                            colonoscopy was performed without difficulty. The                            patient tolerated the procedure well. The quality                            of the bowel preparation was adequate. The                            ileocecal valve, appendiceal orifice, and rectum  were photographed. Scope In: 11:50:24 AM Scope Out: 12:08:46 PM Scope Withdrawal Time: 0 hours 15 minutes 20 seconds  Total Procedure Duration: 0 hours 18 minutes 22 seconds  Findings:                 Skin tags were found on perianal exam.                           A 3 mm polyp was found at the base the cecum. The                            polyp was flat. The polyp was removed with a cold                            snare. Resection and retrieval were complete.                           A 2 mm polyp was found in the cecum. The polyp was                            sessile. The polyp was removed with a cold snare.                            Resection and retrieval were complete.                           A 2 mm polyp was found in the transverse colon. The                            polyp was sessile. The polyp was removed with a                            cold snare. Resection and retrieval were complete.                           A 3 mm polyp was found in the sigmoid  colon. The                            polyp was sessile. The polyp was removed with a                            cold snare. Resection and retrieval were complete.                           Internal hemorrhoids were found during                            retroflexion. The hemorrhoids were small.                           The exam was otherwise without abnormality. Complications:            No immediate complications. Estimated blood loss:  Minimal. Estimated Blood Loss:     Estimated blood loss was minimal. Impression:               - Perianal skin tags found on perianal exam.                           - One 3 to 4 mm polyp in the cecum, removed with a                            cold snare. Resected and retrieved.                           - One 2 mm polyp in the cecum, removed with a cold                            snare. Resected and retrieved.                           - One 2 mm polyp in the transverse colon, removed                            with a cold snare. Resected and retrieved.                           - One 3 mm polyp in the sigmoid colon, removed with                            a cold snare. Resected and retrieved.                           - Internal hemorrhoids.                           - The examination was otherwise normal. Recommendation:           - Patient has a contact number available for                            emergencies. The signs and symptoms of potential                            delayed complications were discussed with the                            patient. Return to normal activities tomorrow.                            Written discharge instructions were provided to the                            patient.                           - Resume previous diet.                           -  Continue present medications.                           - Await pathology results. Anticipate repeat                            colonoscopy in  3 years for surveillance. Elspeth P. Odalis Jordan, MD 05/13/2024 12:17:07 PM This report has been signed electronically.

## 2024-05-13 NOTE — Progress Notes (Signed)
 [facility] History and Physical   Primary Care Physician:  Purcell Emil Schanz, MD   Reason for Procedure:   Appendiceal orifice polyp, history of polyps  Plan:    colonoscopy     HPI: Caroline Bullock is a 69 y.o. female  here for colonoscopy surveillance - had an SSP invading the AP, s/p appendectomy and removal of base of cecum with benign pathology. Colonoscopy to ensure complete removal.   Patient denies any bowel symptoms at this time. No family history of colon cancer known. Otherwise feels well without any cardiopulmonary symptoms.   I have discussed risks / benefits of anesthesia and endoscopic procedure with Caroline Bullock and they wish to proceed with the exams as outlined today.   The patient was provided an opportunity to ask questions and all were answered. The patient agreed with the plan.    Past Medical History:  Diagnosis Date   Acid indigestion    rare - TUMS as needed   Allergy    Anxiety    no current meds.   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   Dental crowns present    also a lower dental implant   PONV (postoperative nausea and vomiting)     Past Surgical History:  Procedure Laterality Date   CESAREAN SECTION     x 3   CHOLECYSTECTOMY     COLONOSCOPY  11/2012   LAPAROSCOPIC APPENDECTOMY N/A 02/26/2024   Procedure: APPENDECTOMY, LAPAROSCOPIC;  Surgeon: Debby Hila, MD;  Location: [facility];  Service: General;  Laterality: N/A;   MASS EXCISION  12/20/2011   Procedure: EXCISION MASS;  Surgeon: Norleen Armor, MD;  Location: [facility];  Service: Orthopedics;  Laterality: Right;   TONSILLECTOMY     TUBAL LIGATION     WISDOM TOOTH EXTRACTION      Prior to Admission medications  Medication Sig Start Date End Date Taking? Authorizing Provider  Cholecalciferol (D 1000 PO) Take 2 drops by mouth daily. Total of 200IU   Yes [provider]  COENZYME Q10-RED YEAST RICE PO Take 2 capsules by mouth at bedtime.   Yes [provider]  Cyanocobalamin (VITAMIN B-12 PO) Take 2,500 mcg by mouth daily.   Yes [provider]  OVER THE COUNTER MEDICATION Take 1 capsule by mouth 3 (three) times daily with meals. Omega Enzymes   Yes [provider]  OVER THE COUNTER MEDICATION Take 1 tablet by mouth 2 (two) times daily. CATECHOLACALM   Yes [provider]  OVER THE COUNTER MEDICATION Take 2 capsules by mouth in the morning. GLYCOBERINE-MX   Yes [provider]  OVER THE COUNTER MEDICATION Take 1 tablet by mouth daily at 12 noon. Anti Gas Formula (Papaya, Ginger, Lobelia)   Yes [provider]  Artificial Tear Ointment (DRY EYES OP) Place 1 drop into both eyes daily as needed (dry eyes).    [provider]  ondansetron  (ZOFRAN ) 4 MG tablet Take 1 tablet (4 mg total) by mouth every 8 (eight) hours as needed for nausea or vomiting. 02/26/24   Thomas, Alicia, MD  sodium chloride  (OCEAN) 0.65 % SOLN nasal spray Place 1 spray into both nostrils daily as needed for congestion.    [provider]    Current Outpatient Medications  Medication Sig Dispense Refill   Cholecalciferol (D 1000 PO) Take 2 drops by mouth daily. Total of 200IU     COENZYME Q10-RED YEAST RICE PO Take 2 capsules by mouth at  bedtime.     Cyanocobalamin (VITAMIN B-12 PO) Take 2,500 mcg by mouth daily.     OVER THE COUNTER MEDICATION Take 1 capsule by mouth 3 (three) times daily with meals. Omega Enzymes     OVER THE COUNTER MEDICATION Take 1 tablet by mouth 2 (two) times daily. CATECHOLACALM     OVER THE COUNTER MEDICATION Take 2 capsules by mouth in the morning. GLYCOBERINE-MX     OVER THE COUNTER MEDICATION Take 1 tablet by mouth daily at 12 noon. Anti Gas Formula (Papaya, Ginger, Lobelia)     Artificial Tear Ointment (DRY EYES OP) Place 1 drop into both eyes daily as needed (dry eyes).     ondansetron  (ZOFRAN ) 4 MG tablet Take 1 tablet (4 mg total) by mouth every 8 (eight) hours as needed for  nausea or vomiting. 20 tablet 0   sodium chloride  (OCEAN) 0.65 % SOLN nasal spray Place 1 spray into both nostrils daily as needed for congestion.     Current Facility-Administered Medications  Medication Dose Route Frequency Provider Last Rate Last Admin   0.9 %  sodium chloride  infusion  500 mL Intravenous Continuous Mariellen Blaney, Elspeth SQUIBB, MD        Allergies as of 05/13/2024 - Review Complete 05/13/2024  Allergen Reaction Noted   Benadryl [diphenhydramine hcl] Hives 10/22/2011   Fish protein-containing drug products Nausea And Vomiting 12/14/2011   Oxycodone -acetaminophen  Nausea Only 02/21/2024   Penicillins Hives 10/22/2011   Shellfish allergy Nausea And Vomiting 10/22/2011   Tramadol  Nausea And Vomiting and Nausea Only 02/13/2016    Family History  Problem Relation Age of Onset   Cancer Mother        spot on lung   Diabetes Mother    Heart disease Father        vascular   Diabetes Sister    Diabetes Maternal Grandmother    Heart disease Maternal Grandmother    Colon cancer Neg Hx    Rectal cancer Neg Hx    Stomach cancer Neg Hx    Esophageal cancer Neg Hx     Social History   Socioeconomic History   Marital status: Married    Spouse name: Not on file   Number of children: Not on file   Years of education: Not on file   Highest education level: Bachelor's degree (e.g., BA, AB, BS)  Occupational History   Not on file  Tobacco Use   Smoking status: Never   Smokeless tobacco: Never  Vaping Use   Vaping status: Never Used  Substance and Sexual Activity   Alcohol use: Not Currently    Alcohol/week: 3.0 - 5.0 standard drinks of alcohol    Types: 3 - 5 Glasses of wine per week    Comment: glass wine 2 x/week   Drug use: No   Sexual activity: Yes    Birth control/protection: None  Other Topics Concern   Not on file  Social History Narrative   Married. Education: [employer]. Exercise: Yes   Social Drivers of Health   Tobacco Use: Low Risk (05/13/2024)   Patient  History    Smoking Tobacco Use: Never    Smokeless Tobacco Use: Never    Passive Exposure: Not on file  Financial Resource Strain: Low Risk (01/29/2024)   Overall Financial Resource Strain (CARDIA)    Difficulty of Paying Living Expenses: Not hard at all  Food Insecurity: No Food Insecurity (01/29/2024)   Epic    Worried About Radiation Protection Practitioner of Food in the Last  Year: Never true    Ran Out of Food in the Last Year: Never true  Transportation Needs: No Transportation Needs (01/29/2024)   Epic    Lack of Transportation (Medical): No    Lack of Transportation (Non-Medical): No  Physical Activity: Insufficiently Active (01/29/2024)   Exercise Vital Sign    Days of Exercise per Week: 6 days    Minutes of Exercise per Session: 20 min  Stress: Stress Concern Present (01/29/2024)   [employer] of Occupational Health - Occupational Stress Questionnaire    Feeling of Stress: To some extent  Social Connections: Socially Integrated (01/29/2024)   Social Connection and Isolation Panel    Frequency of Communication with Friends and Family: More than three times a week    Frequency of Social Gatherings with Friends and Family: More than three times a week    Attends Religious Services: More than 4 times per year    Active Member of [employer] or Organizations: Yes    Attends Banker Meetings: More than 4 times per year    Marital Status: Married  Catering Manager Violence: Not At Risk (01/29/2024)   Epic    Fear of Current or Ex-Partner: No    Emotionally Abused: No    Physically Abused: No    Sexually Abused: No  Depression (PHQ2-9): Low Risk (01/29/2024)   Depression (PHQ2-9)    PHQ-2 Score: 0  Alcohol Screen: Low Risk (01/29/2024)   Alcohol Screen    Last Alcohol Screening Score (AUDIT): 0  Housing: Unknown (02/11/2024)   Received from [facility]   Epic    Unable to Pay for Housing in the Last Year: Not on file    Number of Times Moved in the Last Year:  Not on file    At any time in the past 12 months, were you homeless or living in a shelter (including now)?: No  Utilities: Not At Risk (01/29/2024)   Epic    Threatened with loss of utilities: No  Health Literacy: Adequate Health Literacy (01/29/2024)   B1300 Health Literacy    Frequency of need for help with medical instructions: Never    Review of Systems: All other review of systems negative except as mentioned in the HPI.  Physical Exam: Vital signs BP (!) 148/84   Pulse 86   Temp 97.6 F (36.4 C) (Temporal)   Resp 14   Ht 5' 6 (1.676 m)   Wt 207 lb (93.9 kg)   SpO2 100%   BMI 33.41 kg/m   General:   Alert,  Well-developed, pleasant and cooperative in NAD Lungs:  Clear throughout to auscultation.   Heart:  Regular rate and rhythm Abdomen:  Soft, nontender and nondistended.   Neuro/Psych:  Alert and cooperative. Normal mood and affect. A and O x 3  Marcey Naval, MD [facility]

## 2024-05-13 NOTE — Progress Notes (Signed)
 Report given to PACU, vss

## 2024-05-13 NOTE — Progress Notes (Signed)
 Called to room to assist during endoscopic procedure.  Patient ID and intended procedure confirmed with present staff. Received instructions for my participation in the procedure from the performing physician.

## 2024-05-15 LAB — SURGICAL PATHOLOGY

## 2024-05-18 ENCOUNTER — Ambulatory Visit: Payer: Self-pay | Admitting: Gastroenterology

## 2024-08-25 ENCOUNTER — Encounter: Admitting: Emergency Medicine

## 2025-01-29 ENCOUNTER — Ambulatory Visit
